# Patient Record
Sex: Male | Born: 1980 | Race: White | Hispanic: No
Health system: Southern US, Community
[De-identification: ages and names within clinical notes are randomized; demographics above are authoritative.]

---

## 2012-10-16 ENCOUNTER — Emergency Department (INDEPENDENT_AMBULATORY_CARE_PROVIDER_SITE_OTHER)
Admission: EM | Admit: 2012-10-16 | Discharge: 2012-10-16 | Disposition: A | Discharge status: Home / Self Care | Payer: Self-pay | Source: Home / Self Care

## 2012-10-16 ENCOUNTER — Encounter (HOSPITAL_COMMUNITY): Payer: Self-pay | Admitting: Emergency Medicine

## 2012-10-16 DIAGNOSIS — IMO0002 Reserved for concepts with insufficient information to code with codable children: Secondary | ICD-10-CM

## 2012-10-16 DIAGNOSIS — M5416 Radiculopathy, lumbar region: Secondary | ICD-10-CM

## 2012-10-16 MED ORDER — HYDROCODONE-ACETAMINOPHEN 5-325 MG PO TABS: 1.0000 | ORAL_TABLET | ORAL | Status: DC | PRN

## 2012-10-16 MED ORDER — METHYLPREDNISOLONE 4 MG PO KIT: PACK | ORAL | Status: DC

## 2012-10-16 NOTE — ED Provider Notes (Signed)
History     CSN: [id number]  Arrival date & time 10/16/12  1016   First MD Initiated Contact with Patient 10/16/12 1147      Chief Complaint  Patient presents with  . Sciatica    (Consider location/radiation/quality/duration/timing/severity/associated sxs/prior treatment) HPI Comments: 32 year old male presents with lumbar spine back pain. It developed approximately 2-3 days ago. Pain radiates into the anterolateral hips and down the thighs. He complains of distal thigh pain and numbness to the toes. Denies weakness. Pain is exacerbated by attempting to lie supine from sitting position and to rise back up. Is worse with bending, stooping and pulling. The pain is also increased by standing. He denies  known recent injury but states that several years ago he experienced an injury to his lumbar spine. 2008 he had an MRI and was told that he had what sounds like desiccated disc in L4 and 5. This pain syndrome occurs approximately once every 2-3 years.   History reviewed. No pertinent past medical history.  History reviewed. No pertinent past surgical history.  No family history on file.  History  Substance Use Topics  . Smoking status: Current Every Day Smoker -- 0.75 packs/day    Types: Cigarettes  . Smokeless tobacco: Not on file  . Alcohol Use: No      Review of Systems  Constitutional: Negative.   Respiratory: Negative.   Gastrointestinal: Negative.   Genitourinary: Negative.   Musculoskeletal: Positive for back pain.       As per HPI  Skin: Negative.   Neurological: Negative for dizziness, weakness, numbness and headaches.  Psychiatric/Behavioral: Negative.     Allergies  Review of patient's allergies indicates no known allergies.  Home Medications   Current Outpatient Rx  Name  Route  Sig  Dispense  Refill  . HYDROcodone-acetaminophen (NORCO/VICODIN) 5-325 MG per tablet   Oral   Take 1 tablet by mouth every 4 (four) hours as needed for pain.   15 tablet    0   . methylPREDNISolone (MEDROL DOSEPAK) 4 MG tablet      follow package directions   21 tablet   0     BP 109/68  Pulse 56  Temp(Src) 98.6 F (37 C) (Oral)  Resp 16  SpO2 100%  Physical Exam  Nursing note and vitals reviewed. Constitutional: He is oriented to person, place, and time. He appears well-developed and well-nourished.  HENT:  Head: Normocephalic and atraumatic.  Eyes: EOM are normal. Left eye exhibits no discharge.  Neck: Normal range of motion. Neck supple.  Cardiovascular: Normal rate and normal heart sounds.   Pulmonary/Chest: Effort normal and breath sounds normal. No respiratory distress.  Abdominal: Soft. He exhibits no distension. There is no tenderness.  Musculoskeletal:  Percussion tenderness to the mid lumbar spine and sacrum. No paralumbar muscular  tenderness. however, muscle pain is reproduced with movement is as described in history of present illness. No deformity, step off deformity of the spine. No tenderness in the hips or thighs. Negative straight leg raises bilaterally distal neurovascular and motor sensory is intact.  Neurological: He is alert and oriented to person, place, and time. No cranial nerve deficit.  Skin: Skin is warm and dry.  Psychiatric: He has a normal mood and affect.    ED Course  Procedures (including critical care time)  Labs Reviewed - No data to display No results found.   1. Lumbar radicular pain       MDM  Limit activities that exacerbate the pain.  Apply ice to the back. Medrol Dosepak as directed Norco for pain every 4 hours when necessary, #15 Call the orthopedist above for further evaluation.  Declined to have an injection such as Toradol.        Hayden Rasmussen, NP 10/16/12 [phone]

## 2012-10-16 NOTE — ED Notes (Signed)
Pt c/o possible sciatica. This is a recurrent problem. Feels pain from hips that radiates down to both legs. Numbness in toes. Has been rotating between heat and ice with mild relief. Took 1/2 Hydrocodone this morning. Patient is alert and oriented.

## 2012-10-16 NOTE — ED Provider Notes (Signed)
Medical screening examination/treatment/procedure(s) were performed by non-physician practitioner and as supervising physician I was immediately available for consultation/collaboration.  Jihan Mellette   Tzirel Leonor, MD 10/16/12 1410 

## 2015-05-28 ENCOUNTER — Encounter (HOSPITAL_COMMUNITY): Payer: Self-pay | Admitting: Emergency Medicine

## 2015-05-28 ENCOUNTER — Emergency Department (HOSPITAL_COMMUNITY)
Admission: EM | Admit: 2015-05-28 | Discharge: 2015-05-28 | Disposition: A | Discharge status: Home / Self Care | Payer: Self-pay | Attending: Emergency Medicine | Admitting: Emergency Medicine

## 2015-05-28 DIAGNOSIS — F17210 Nicotine dependence, cigarettes, uncomplicated: Secondary | ICD-10-CM | POA: Insufficient documentation

## 2015-05-28 DIAGNOSIS — S50812A Abrasion of left forearm, initial encounter: Secondary | ICD-10-CM | POA: Insufficient documentation

## 2015-05-28 DIAGNOSIS — Y998 Other external cause status: Secondary | ICD-10-CM | POA: Insufficient documentation

## 2015-05-28 DIAGNOSIS — Y9389 Activity, other specified: Secondary | ICD-10-CM | POA: Insufficient documentation

## 2015-05-28 DIAGNOSIS — S1081XA Abrasion of other specified part of neck, initial encounter: Secondary | ICD-10-CM | POA: Insufficient documentation

## 2015-05-28 DIAGNOSIS — Y9289 Other specified places as the place of occurrence of the external cause: Secondary | ICD-10-CM | POA: Insufficient documentation

## 2015-05-28 DIAGNOSIS — T07XXXA Unspecified multiple injuries, initial encounter: Secondary | ICD-10-CM
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
## 2015-05-28 NOTE — Discharge Instructions (Signed)
Return here as needed.  Keep the areas clean and dry. °

## 2015-05-28 NOTE — ED Notes (Signed)
PA at bedside.

## 2016-01-18 ENCOUNTER — Inpatient Hospital Stay (HOSPITAL_COMMUNITY)
Admission: EM | Admit: 2016-01-18 | Discharge: 2016-01-24 | DRG: 581 | Disposition: A | Discharge status: Home / Self Care | Payer: Self-pay | Attending: General Surgery | Admitting: General Surgery

## 2016-01-18 ENCOUNTER — Encounter (HOSPITAL_COMMUNITY): Payer: Self-pay | Admitting: Emergency Medicine

## 2016-01-18 DIAGNOSIS — R2242 Localized swelling, mass and lump, left lower limb: Secondary | ICD-10-CM

## 2016-01-18 DIAGNOSIS — L0291 Cutaneous abscess, unspecified: Secondary | ICD-10-CM

## 2016-01-18 DIAGNOSIS — F17210 Nicotine dependence, cigarettes, uncomplicated: Secondary | ICD-10-CM | POA: Diagnosis present

## 2016-01-18 DIAGNOSIS — L02416 Cutaneous abscess of left lower limb: Principal | ICD-10-CM | POA: Diagnosis present

## 2016-01-18 NOTE — ED Triage Notes (Signed)
P presents to ED for assessment of a large "tumor-like" thing that has "been there for years", grew and started bleeding" today.  Pt sts this area has doubled in size x 2 months.  Pt sts painful, especially with palpation

## 2016-01-19 ENCOUNTER — Encounter (HOSPITAL_COMMUNITY): Admission: EM | Disposition: A | Discharge status: Home / Self Care | Payer: Self-pay | Source: Home / Self Care

## 2016-01-19 ENCOUNTER — Inpatient Hospital Stay (HOSPITAL_COMMUNITY): Payer: Self-pay | Admitting: Anesthesiology

## 2016-01-19 ENCOUNTER — Encounter (HOSPITAL_COMMUNITY): Payer: Self-pay | Admitting: Anesthesiology

## 2016-01-19 DIAGNOSIS — L02416 Cutaneous abscess of left lower limb: Secondary | ICD-10-CM | POA: Diagnosis present

## 2016-01-19 HISTORY — PX: INCISION AND DRAINAGE ABSCESS: SHX5864

## 2016-01-19 LAB — CBC WITH DIFFERENTIAL/PLATELET
BASOS ABS: 0 10*3/uL (ref 0.0–0.1)
Basophils Relative: 0 %
Eosinophils Absolute: 0.1 10*3/uL (ref 0.0–0.7)
Eosinophils Relative: 1 %
HEMATOCRIT: 41.3 % (ref 39.0–52.0)
Hemoglobin: 13.8 g/dL (ref 13.0–17.0)
Lymphocytes Relative: 27 %
Lymphs Abs: 2.7 10*3/uL (ref 0.7–4.0)
MCH: 29.7 pg (ref 26.0–34.0)
MCHC: 33.4 g/dL (ref 30.0–36.0)
MCV: 89 fL (ref 78.0–100.0)
MONO ABS: 0.7 10*3/uL (ref 0.1–1.0)
MONOS PCT: 7 %
Neutro Abs: 6.6 10*3/uL (ref 1.7–7.7)
Neutrophils Relative %: 66 %
Platelets: 299 10*3/uL (ref 150–400)
RBC: 4.64 MIL/uL (ref 4.22–5.81)
RDW: 12.9 % (ref 11.5–15.5)
WBC: 10 10*3/uL (ref 4.0–10.5)

## 2016-01-19 LAB — BASIC METABOLIC PANEL
ANION GAP: 10 (ref 5–15)
Anion gap: 10 (ref 5–15)
BUN: 8 mg/dL (ref 6–20)
BUN: 8 mg/dL (ref 6–20)
CALCIUM: 9.5 mg/dL (ref 8.9–10.3)
CHLORIDE: 99 mmol/L — AB (ref 101–111)
CO2: 24 mmol/L (ref 22–32)
CO2: 28 mmol/L (ref 22–32)
CREATININE: 0.88 mg/dL (ref 0.61–1.24)
CREATININE: 0.88 mg/dL (ref 0.61–1.24)
Calcium: 9.2 mg/dL (ref 8.9–10.3)
Chloride: 100 mmol/L — ABNORMAL LOW (ref 101–111)
GFR calc Af Amer: >60 mL/min (ref >60)
GFR calc non Af Amer: >60 mL/min (ref >60)
GLUCOSE: 103 mg/dL — AB (ref 65–99)
GLUCOSE: 103 mg/dL — AB (ref 65–99)
Potassium: 3.7 mmol/L (ref 3.5–5.1)
Potassium: 3.5 mmol/L (ref 3.5–5.1)
SODIUM: 134 mmol/L — AB (ref 135–145)
Sodium: 137 mmol/L (ref 135–145)

## 2016-01-19 LAB — CBC
HEMATOCRIT: 39.5 % (ref 39.0–52.0)
HEMOGLOBIN: 13.1 g/dL (ref 13.0–17.0)
MCH: 30 pg (ref 26.0–34.0)
MCHC: 33.2 g/dL (ref 30.0–36.0)
MCV: 90.4 fL (ref 78.0–100.0)
Platelets: 300 10*3/uL (ref 150–400)
RBC: 4.37 MIL/uL (ref 4.22–5.81)
RDW: 13 % (ref 11.5–15.5)
WBC: 8.2 10*3/uL (ref 4.0–10.5)

## 2016-01-19 LAB — SURGICAL PCR SCREEN
MRSA, PCR: NEGATIVE
STAPHYLOCOCCUS AUREUS: NEGATIVE

## 2016-01-19 LAB — I-STAT TROPONIN, ED: Troponin i, poc: 0 ng/mL (ref 0.00–0.08)

## 2016-01-19 LAB — I-STAT CG4 LACTIC ACID, ED: LACTIC ACID, VENOUS: 1.63 mmol/L (ref 0.5–1.9)

## 2016-01-19 SURGERY — INCISION AND DRAINAGE, ABSCESS
Anesthesia: General | Site: Thigh | Laterality: Left

## 2016-01-19 MED ORDER — FENTANYL CITRATE (PF) 100 MCG/2ML IJ SOLN
25.0000 ug | INTRAMUSCULAR | Status: DC | PRN
  Administered 2016-01-19: 50 ug via INTRAVENOUS

## 2016-01-19 MED ORDER — BUPIVACAINE-EPINEPHRINE (PF) 0.25% -1:200000 IJ SOLN
INTRAMUSCULAR | Status: AC
  Filled 2016-01-19: qty 30

## 2016-01-19 MED ORDER — ONDANSETRON HCL 4 MG/2ML IJ SOLN
INTRAMUSCULAR | Status: DC | PRN
  Administered 2016-01-19: 4 mg via INTRAVENOUS

## 2016-01-19 MED ORDER — DIPHENHYDRAMINE HCL 50 MG/ML IJ SOLN: 25.0000 mg | Freq: Four times a day (QID) | INTRAMUSCULAR | Status: DC | PRN

## 2016-01-19 MED ORDER — PROPOFOL 10 MG/ML IV BOLUS
INTRAVENOUS | Status: DC | PRN
  Administered 2016-01-19: 200 mg via INTRAVENOUS

## 2016-01-19 MED ORDER — 0.9 % SODIUM CHLORIDE (POUR BTL) OPTIME
TOPICAL | Status: DC | PRN
  Administered 2016-01-19: 1000 mL

## 2016-01-19 MED ORDER — BUPIVACAINE-EPINEPHRINE 0.25% -1:200000 IJ SOLN
INTRAMUSCULAR | Status: DC | PRN
  Administered 2016-01-19: 10 mL

## 2016-01-19 MED ORDER — POTASSIUM CHLORIDE IN NACL 20-0.9 MEQ/L-% IV SOLN
INTRAVENOUS | Status: DC
  Administered 2016-01-19: 08:00:00 via INTRAVENOUS
  Administered 2016-01-19: 125 mL via INTRAVENOUS
  Administered 2016-01-19: 22:00:00 via INTRAVENOUS
  Administered 2016-01-20: 1000 mL via INTRAVENOUS
  Administered 2016-01-21 – 2016-01-23 (×4): via INTRAVENOUS
  Filled 2016-01-19 (×9): qty 1000

## 2016-01-19 MED ORDER — OXYCODONE HCL 5 MG PO TABS: 5.0000 mg | ORAL_TABLET | Freq: Once | ORAL | Status: DC | PRN

## 2016-01-19 MED ORDER — CEFAZOLIN IN D5W 1 GM/50ML IV SOLN
1.0000 g | Freq: Once | INTRAVENOUS | Status: AC
Start: 2016-01-19 — End: 2016-01-19
  Administered 2016-01-19: 1 g via INTRAVENOUS
  Filled 2016-01-19: qty 50

## 2016-01-19 MED ORDER — CHLORHEXIDINE GLUCONATE CLOTH 2 % EX PADS
6.0000 | MEDICATED_PAD | Freq: Once | CUTANEOUS | Status: AC
  Administered 2016-01-19: 6 via TOPICAL

## 2016-01-19 MED ORDER — FENTANYL CITRATE (PF) 100 MCG/2ML IJ SOLN
INTRAMUSCULAR | Status: DC | PRN
  Administered 2016-01-19 (×4): 25 ug via INTRAVENOUS

## 2016-01-19 MED ORDER — HYDROMORPHONE HCL 1 MG/ML IJ SOLN
1.0000 mg | INTRAMUSCULAR | Status: DC | PRN
  Administered 2016-01-19 – 2016-01-24 (×19): 1 mg via INTRAVENOUS
  Filled 2016-01-19 (×20): qty 1

## 2016-01-19 MED ORDER — PROPOFOL 10 MG/ML IV BOLUS
INTRAVENOUS | Status: AC
  Filled 2016-01-19: qty 20

## 2016-01-19 MED ORDER — PROMETHAZINE HCL 25 MG/ML IJ SOLN: 6.2500 mg | INTRAMUSCULAR | Status: DC | PRN

## 2016-01-19 MED ORDER — CHLORHEXIDINE GLUCONATE CLOTH 2 % EX PADS: 6.0000 | MEDICATED_PAD | Freq: Once | CUTANEOUS | Status: DC

## 2016-01-19 MED ORDER — FENTANYL CITRATE (PF) 100 MCG/2ML IJ SOLN
INTRAMUSCULAR | Status: AC
  Filled 2016-01-19: qty 2

## 2016-01-19 MED ORDER — OXYCODONE HCL 5 MG/5ML PO SOLN: 5.0000 mg | Freq: Once | ORAL | Status: DC | PRN

## 2016-01-19 MED ORDER — ENOXAPARIN SODIUM 40 MG/0.4ML ~~LOC~~ SOLN
40.0000 mg | SUBCUTANEOUS | Status: DC
  Filled 2016-01-19 (×3): qty 0.4

## 2016-01-19 MED ORDER — OXYCODONE-ACETAMINOPHEN 5-325 MG PO TABS
1.0000 | ORAL_TABLET | ORAL | Status: DC | PRN
  Administered 2016-01-20 – 2016-01-23 (×2): 1 via ORAL
  Administered 2016-01-23 – 2016-01-24 (×3): 2 via ORAL
  Filled 2016-01-19: qty 1
  Filled 2016-01-19 (×2): qty 2
  Filled 2016-01-19: qty 1
  Filled 2016-01-19: qty 2

## 2016-01-19 MED ORDER — MIDAZOLAM HCL 2 MG/2ML IJ SOLN
INTRAMUSCULAR | Status: AC
  Filled 2016-01-19: qty 2

## 2016-01-19 MED ORDER — ONDANSETRON 4 MG PO TBDP: 4.0000 mg | ORAL_TABLET | Freq: Four times a day (QID) | ORAL | Status: DC | PRN

## 2016-01-19 MED ORDER — LACTATED RINGERS IV SOLN
INTRAVENOUS | Status: DC | PRN
  Administered 2016-01-19 (×2): via INTRAVENOUS

## 2016-01-19 MED ORDER — DIPHENHYDRAMINE HCL 25 MG PO CAPS
25.0000 mg | ORAL_CAPSULE | Freq: Four times a day (QID) | ORAL | Status: DC | PRN
  Administered 2016-01-19 – 2016-01-24 (×5): 25 mg via ORAL
  Filled 2016-01-19 (×5): qty 1

## 2016-01-19 MED ORDER — ONDANSETRON HCL 4 MG/2ML IJ SOLN: 4.0000 mg | Freq: Four times a day (QID) | INTRAMUSCULAR | Status: DC | PRN

## 2016-01-19 MED ORDER — MIDAZOLAM HCL 2 MG/2ML IJ SOLN
INTRAMUSCULAR | Status: DC | PRN
  Administered 2016-01-19: 2 mg via INTRAVENOUS

## 2016-01-19 MED ORDER — ONDANSETRON HCL 4 MG/2ML IJ SOLN
INTRAMUSCULAR | Status: AC
  Filled 2016-01-19: qty 2

## 2016-01-19 MED ORDER — PIPERACILLIN-TAZOBACTAM 3.375 G IVPB
3.3750 g | Freq: Three times a day (TID) | INTRAVENOUS | Status: DC
  Administered 2016-01-19 – 2016-01-23 (×13): 3.375 g via INTRAVENOUS
  Filled 2016-01-19 (×15): qty 50

## 2016-01-19 MED ORDER — LIDOCAINE HCL (CARDIAC) 20 MG/ML IV SOLN
INTRAVENOUS | Status: DC | PRN
  Administered 2016-01-19: 80 mg via INTRATRACHEAL

## 2016-01-19 MED ORDER — DEXAMETHASONE SODIUM PHOSPHATE 10 MG/ML IJ SOLN
INTRAMUSCULAR | Status: DC | PRN
  Administered 2016-01-19: 10 mg via INTRAVENOUS

## 2016-01-19 SURGICAL SUPPLY — 39 items
BANDAGE ACE 6X5 VEL STRL LF (GAUZE/BANDAGES/DRESSINGS) ×2 IMPLANT
BNDG GAUZE ELAST 4 BULKY (GAUZE/BANDAGES/DRESSINGS) ×2 IMPLANT
CANISTER SUCTION 2500CC (MISCELLANEOUS) ×2 IMPLANT
CLEANER TIP ELECTROSURG 2X2 (MISCELLANEOUS) ×2 IMPLANT
CONT SPEC 4OZ CLIKSEAL STRL BL (MISCELLANEOUS) ×4 IMPLANT
COVER SURGICAL LIGHT HANDLE (MISCELLANEOUS) ×2 IMPLANT
DRAPE IMP U-DRAPE 54X76 (DRAPES) ×2 IMPLANT
DRAPE LAPAROSCOPIC ABDOMINAL (DRAPES) ×2 IMPLANT
DRAPE ORTHO SPLIT 77X108 STRL (DRAPES) ×2
DRAPE SURG ORHT 6 SPLT 77X108 (DRAPES) ×2 IMPLANT
DRAPE UTILITY XL STRL (DRAPES) ×2 IMPLANT
DRSG PAD ABDOMINAL 8X10 ST (GAUZE/BANDAGES/DRESSINGS) ×2 IMPLANT
ELECT CAUTERY BLADE 6.4 (BLADE) ×2 IMPLANT
ELECT REM PT RETURN 9FT ADLT (ELECTROSURGICAL) ×2
ELECTRODE REM PT RTRN 9FT ADLT (ELECTROSURGICAL) ×1 IMPLANT
GAUZE SPONGE 4X4 12PLY STRL (GAUZE/BANDAGES/DRESSINGS) IMPLANT
GLOVE BIO SURGEON STRL SZ8 (GLOVE) ×2 IMPLANT
GLOVE BIOGEL PI IND STRL 8 (GLOVE) ×1 IMPLANT
GLOVE BIOGEL PI IND STRL 8.5 (GLOVE) ×1 IMPLANT
GLOVE BIOGEL PI INDICATOR 8 (GLOVE) ×1
GLOVE BIOGEL PI INDICATOR 8.5 (GLOVE) ×1
GLOVE SURG SS PI 8.0 STRL IVOR (GLOVE) ×2 IMPLANT
GOWN STRL REUS W/ TWL XL LVL3 (GOWN DISPOSABLE) ×2 IMPLANT
GOWN STRL REUS W/TWL XL LVL3 (GOWN DISPOSABLE) ×2
KIT BASIN OR (CUSTOM PROCEDURE TRAY) ×2 IMPLANT
KIT ROOM TURNOVER OR (KITS) ×2 IMPLANT
NEEDLE HYPO 25GX1X1/2 BEV (NEEDLE) ×2 IMPLANT
NS IRRIG 1000ML POUR BTL (IV SOLUTION) ×2 IMPLANT
PACK GENERAL/GYN (CUSTOM PROCEDURE TRAY) ×2 IMPLANT
PAD ARMBOARD 7.5X6 YLW CONV (MISCELLANEOUS) ×4 IMPLANT
SPECIMEN JAR SMALL (MISCELLANEOUS) IMPLANT
SUT SILK 2 0 (SUTURE) ×1
SUT SILK 2-0 18XBRD TIE 12 (SUTURE) ×1 IMPLANT
SUT VICRYL AB 2 0 TIES (SUTURE) ×2 IMPLANT
SWAB COLLECTION DEVICE MRSA (MISCELLANEOUS) ×2 IMPLANT
SYR CONTROL 10ML LL (SYRINGE) ×2 IMPLANT
TOWEL OR 17X24 6PK STRL BLUE (TOWEL DISPOSABLE) ×4 IMPLANT
TOWEL OR 17X26 10 PK STRL BLUE (TOWEL DISPOSABLE) ×2 IMPLANT
TUBE ANAEROBIC SPECIMEN COL (MISCELLANEOUS) ×2 IMPLANT

## 2016-01-19 NOTE — Anesthesia Preprocedure Evaluation (Addendum)
Anesthesia Evaluation  Patient identified by MRN, date of birth, ID band Patient awake    Reviewed: Allergy & Precautions, NPO status , Patient's Chart, lab work & pertinent test results  Airway Mallampati: II  TM Distance: >3 FB Neck ROM: Full    Dental no notable dental hx.    Pulmonary neg pulmonary ROS, Current Smoker,    Pulmonary exam normal        Cardiovascular negative cardio ROS Normal cardiovascular exam     Neuro/Psych negative neurological ROS     GI/Hepatic negative GI ROS, (+)     substance abuse  alcohol use and marijuana use,   Endo/Other  negative endocrine ROS  Renal/GU negative Renal ROS     Musculoskeletal negative musculoskeletal ROS (+)   Abdominal   Peds  Hematology negative hematology ROS (+)   Anesthesia Other Findings   Reproductive/Obstetrics                            Anesthesia Physical Anesthesia Plan  ASA: I  Anesthesia Plan: General   Post-op Pain Management:    Induction: Intravenous  Airway Management Planned: LMA  Additional Equipment: None  Intra-op Plan:   Post-operative Plan: Extubation in OR  Informed Consent: I have reviewed the patients History and Physical, chart, labs and discussed the procedure including the risks, benefits and alternatives for the proposed anesthesia with the patient or authorized representative who has indicated his/her understanding and acceptance.   Dental advisory given  Plan Discussed with:   Anesthesia Plan Comments:         Anesthesia Quick Evaluation

## 2016-01-19 NOTE — Interval H&P Note (Signed)
History and Physical Interval Note:  01/19/2016 9:00 AM  Cephus RicherJonathan Schwalm  has presented today for surgery, with the diagnosis of Left Thigh Abscess  The various methods of treatment have been discussed with the patient and family. After consideration of risks, benefits and other options for treatment, the patient has consented to  Procedure(s): INCISION AND DRAINAGE ABSCESS LEFT THIGH (Left) as a surgical intervention .  The patient's history has been reviewed, patient examined, no change in status, stable for surgery.  I have reviewed the patient's chart and labs.  Pt seen and examined.   Case discussed with the patient  Potential complications and wound care discussed and prolonged healing taking up to 3 - 6 months or longer.  He has ignored this for over a year.  May need wound vac as well. Potential complications discussed.  The procedure has been discussed with the patient.  Alternative therapies have been discussed with the patient.  Operative risks include bleeding,  Infection,  Organ injury,  Nerve injury,  Blood vessel injury,  DVT,  Pulmonary embolism,  Death,  And possible reoperation.  Medical management risks include worsening of present situation.  The success of the procedure is 50 -90 % at treating patients symptoms.  The patient understands and agrees to proceed.   Questions were answered to the patient's satisfaction.     Idamae Coccia A.

## 2016-01-19 NOTE — Progress Notes (Signed)
Pt arrived to 5 west at 0645. Fully alert and oriented on admission, VS stable, no signs of distress. Pt oriented to unit, call light with in reach. Report given to day shift nurse.

## 2016-01-19 NOTE — Op Note (Signed)
Preoperative diagnosis: Left thigh abscess measuring 10 x 15 cm  Postoperative diagnosis: Infected left thigh mass with abscess involving posterior compartment of left upper thigh  Procedure: Incision and drainage of left upper thigh  mass with biopsy of large necrotic left upper posterior medial thigh mass   10 x 15 cm   Surgeon: Erroll Luna M.D.  Anesthesia: Gen. with 0.25% Sensorcaine local  EBL: 100 mL  Specimens: Multiple biopsies of skin and subcutaneous mass to pathology with wound cultures taken  Drains: None  Indications for procedure: The patient presents for drainage of a large left posterior thigh abscess.  He states this area has been present for well over one year. It has gotten larger over the last 4-6 months. He was seen this morning by Dr.Tsuei in the emergency room and felt to have a large left thigh abscess that has been neglected. Patient is a poor historian. He was seen in the holding area and his left eye was examined. His left lower extremity was neurovascularly intact. His motor and sensory function were intact. Examination of his left upper posterior medial thigh revealed a softball size mass with a central area of necrosis and dead  tissue. This was red  and tender.  I discussed the procedure great length with him. Recommended incision and drainage of this infectious process. There are be a large open wound since this area would potentially have to be extensively debrided. Discussed risk of the procedure with him preoperatively to include but not exclusive of bleeding, infection, injury to blood vessel, injury to nerve leading to numbness and or loss of function of extremity, death, DVT, progression of underlying process, potential limb loss, and the need for other treatments and or surgery. He voiced his understanding and agreed to proceed.  Description of procedure: The patient was met in the holding area. Left leg was marked as the correct side at the operative site.  He was taken back to the operating room after answering all his questions. He was placed supine on the OR table and his left leg was frog leg position. General anesthesia was initiated. Timeout was done to verify proper patient and procedure. He was already placed on antibiotics in the emergency room and these were continued. After sterile prep and drape the ulcerative process in the left posterior thigh was opened with cautery. This was about 10 cm in maximal diameter and extended into the posterior compartment of his left upper thigh involving the musculature. Upon unroofing the abscess, there is a large necrotic mass. Multiple biopsies of this mass were taken and sent to pathology. The mass extended medially to include the saphenous vein superficially. This was thrombosed upon examination and ligated. The process had the appearance of a neoplastic process that is now infected and neglected. The wound was copiously irrigated with saline. Hemostasis was achieved with cautery. The opening to the wound measured 5 cm x 7 cm and was about 2 cm deep. All pathology specimen and culture specimens taken were sent to microbiology and pathology. Sterile dressing of Kerlix was wrapped after packing the wound with saline soaked Kerlix. All large Ace wrap was placed. All final counts were found to be correct. The patient was awoke taken recovery in satisfactory condition.

## 2016-01-19 NOTE — H&P (Signed)
Tom Le is an 35 y.o. male.   Chief Complaint: Left thigh abscess HPI: This is a 35 yo male in otherwise good health who presents with a large area of swelling, inflammation, and drainage from his proximal medial left thigh.  He states that he has had a small mass there for over a year, but over the last several weeks it has rapidly enlarged and become more tender.  Over the last few days, it has begun draining.  He finally came to the ED for evaluation.  No imaging performed.  History reviewed. No pertinent past medical history.  History reviewed. No pertinent surgical history.  History reviewed. No pertinent family history. Social History:  reports that he has been smoking Cigarettes.  He has been smoking about 1.00 pack per day. He has never used smokeless tobacco. He reports that he drinks alcohol. He reports that he uses drugs, including Marijuana, about 2 times per week.  Allergies: No Known Allergies  Prior to Admission medications   Not on File     Results for orders placed or performed during the hospital encounter of 01/18/16 (from the past 48 hour(s))  Basic metabolic panel     Status: Abnormal   Collection Time: 01/19/16  2:25 AM  Result Value Ref Range   Sodium 134 (L) 135 - 145 mmol/L   Potassium 3.7 3.5 - 5.1 mmol/L   Chloride 100 (L) 101 - 111 mmol/L   CO2 24 22 - 32 mmol/L   Glucose, Bld 103 (H) 65 - 99 mg/dL   BUN 8 6 - 20 mg/dL   Creatinine, Ser 0.88 0.61 - 1.24 mg/dL   Calcium 9.5 8.9 - 10.3 mg/dL   GFR calc non Af Amer >60 >60 mL/min   GFR calc Af Amer >60 >60 mL/min    Comment: (NOTE) The eGFR has been calculated using the CKD EPI equation. This calculation has not been validated in all clinical situations. eGFR's persistently <60 mL/min signify possible Chronic Kidney Disease.    Anion gap 10 5 - 15  CBC with Differential     Status: None   Collection Time: 01/19/16  2:25 AM  Result Value Ref Range   WBC 10.0 4.0 - 10.5 K/uL   RBC 4.64 4.22  - 5.81 MIL/uL   Hemoglobin 13.8 13.0 - 17.0 g/dL   HCT 41.3 39.0 - 52.0 %   MCV 89.0 78.0 - 100.0 fL   MCH 29.7 26.0 - 34.0 pg   MCHC 33.4 30.0 - 36.0 g/dL   RDW 12.9 11.5 - 15.5 %   Platelets 299 150 - 400 K/uL   Neutrophils Relative % 66 %   Neutro Abs 6.6 1.7 - 7.7 K/uL   Lymphocytes Relative 27 %   Lymphs Abs 2.7 0.7 - 4.0 K/uL   Monocytes Relative 7 %   Monocytes Absolute 0.7 0.1 - 1.0 K/uL   Eosinophils Relative 1 %   Eosinophils Absolute 0.1 0.0 - 0.7 K/uL   Basophils Relative 0 %   Basophils Absolute 0.0 0.0 - 0.1 K/uL  I-stat troponin, ED     Status: None   Collection Time: 01/19/16  2:41 AM  Result Value Ref Range   Troponin i, poc 0.00 0.00 - 0.08 ng/mL   Comment 3            Comment: Due to the release kinetics of cTnI, a negative result within the first hours of the onset of symptoms does not rule out myocardial infarction with certainty. If  myocardial infarction is still suspected, repeat the test at appropriate intervals.   I-Stat CG4 Lactic Acid, ED     Status: None   Collection Time: 01/19/16  3:13 AM  Result Value Ref Range   Lactic Acid, Venous 1.63 0.5 - 1.9 mmol/L   No results found.  Review of Systems  Constitutional: Negative for weight loss.  HENT: Negative for ear discharge, ear pain, hearing loss and tinnitus.   Eyes: Negative for blurred vision, double vision, photophobia and pain.  Respiratory: Negative for cough, sputum production and shortness of breath.   Cardiovascular: Positive for leg swelling (Proximal medial left thigh). Negative for chest pain.  Gastrointestinal: Negative for abdominal pain, nausea and vomiting.  Genitourinary: Negative for dysuria, flank pain, frequency and urgency.  Musculoskeletal: Negative for back pain, falls, joint pain, myalgias and neck pain.  Neurological: Negative for dizziness, tingling, sensory change, focal weakness, loss of consciousness and headaches.  Endo/Heme/Allergies: Does not bruise/bleed easily.   Psychiatric/Behavioral: Negative for depression, memory loss and substance abuse. The patient is not nervous/anxious.     Blood pressure 96/67, pulse 81, temperature 98.5 F (36.9 C), temperature source Oral, resp. rate 18, height 5' 6"  (1.676 m), weight 92.9 kg (204 lb 14.4 oz), SpO2 98 %. Physical Exam  WDWN in NAD HEENT:  EOMI, sclera anicteric Neck:  No masses, no thyromegaly Lungs:  CTA bilaterally; normal respiratory effort CV:  Regular rate and rhythm; no murmurs Abd:  +bowel sounds, soft, non-tender, no masses Ext:  Well-perfused; no edema Skin:  Warm, dry; proximal medial left thigh - 10 cm area of swelling and firm induration with some central drainage; about 20 cm of surrounding cellulitis.  The entire area of firmness does not seem to be fixed to underlying muscle.  Assessment/Plan Large left thigh abscess   Admit to hospital To OR later today for incision/ drainage/ debridement of large left thigh abscess.  Patient will likely have a large open wound for some time.  IV abx NPO    Tory Mckissack K., MD 01/19/2016, 4:29 AM

## 2016-01-19 NOTE — Transfer of Care (Signed)
Immediate Anesthesia Transfer of Care Note  Patient: Tom RicherJonathan Wholey  Procedure(s) Performed: Procedure(s): INCISION AND DRAINAGE ABSCESS LEFT THIGH (Left)  Patient Location: PACU  Anesthesia Type:General  Level of Consciousness: awake, alert , oriented and patient cooperative  Airway & Oxygen Therapy: Patient Spontanous Breathing  Post-op Assessment: Report given to RN and Post -op Vital signs reviewed and stable  Post vital signs: Reviewed and stable  Last Vitals:  Vitals:   01/19/16 0652 01/19/16 1030  BP: 124/75 128/84  Pulse: 80 81  Resp: 18 15  Temp: 37.1 C 36.8 C    Last Pain:  Vitals:   01/19/16 0755  TempSrc:   PainSc: 0-No pain         Complications: No apparent anesthesia complications

## 2016-01-19 NOTE — ED Provider Notes (Signed)
[facility]-EMERGENCY DEPT Provider Note   CSN: [id number] Arrival date & time: 01/18/16  2239     History   Chief Complaint Chief Complaint  Patient presents with  . Abscess    HPI Tom Le is a 35 y.o. male.  Tom Le is a 35 y.o. Male with no pertinent medical history presents to ED with complaint of abscess on his left upper thigh. Patient states abscess has been present for awhile; however, over the last three weeks it has tripled in. Approximately 24 hours ago, patient reports abscess started draining blood. Endorses associated surround erythema, warmth, and pain. No fever, chills, night sweats, abdominal pain, dysuria, hematuria, chest pain, SOB, arthralgias, or myalgias.        History reviewed. No pertinent past medical history.  There are no active problems to display for this patient.   History reviewed. No pertinent surgical history.     Home Medications    Prior to Admission medications   Not on File    Family History History reviewed. No pertinent family history.  Social History Social History  Substance Use Topics  . Smoking status: Current Every Day Smoker    Packs/day: 1.00    Types: Cigarettes  . Smokeless tobacco: Never Used  . Alcohol use Yes     Comment: beer and liquor almost every day     Allergies   Review of patient's allergies indicates no known allergies.   Review of Systems Review of Systems  Skin: Positive for wound.  All other systems reviewed and are negative.    Physical Exam Updated Vital Signs BP 131/97 (BP Location: Left Arm)   Pulse 106   Temp 98.5 F (36.9 C) (Oral)   Resp 18   Ht 5\' 6"  (1.676 m)   Wt 92.9 kg   SpO2 99%   BMI 33.07 kg/m   Physical Exam  Constitutional: He appears well-developed and well-nourished. No distress.  HENT:  Head: Normocephalic and atraumatic.  Mouth/Throat: Oropharynx is clear and moist. No oropharyngeal exudate.  Eyes: Conjunctivae and EOM are normal.  Pupils are equal, round, and reactive to light. Right eye exhibits no discharge. Left eye exhibits no discharge. No scleral icterus.  Neck: Normal range of motion. Neck supple.  Cardiovascular: Normal rate, regular rhythm, normal heart sounds and intact distal pulses.   No murmur heard. Pulmonary/Chest: Effort normal and breath sounds normal. No respiratory distress.  Abdominal: Soft. Bowel sounds are normal. There is no tenderness. There is no rebound and no guarding.  Musculoskeletal: Normal range of motion.  Lymphadenopathy:    He has no cervical adenopathy.  Neurological: He is alert. Coordination normal.  Skin: Skin is warm and dry. He is not diaphoretic.  Large abscess approximate size of small canteloupe with surrounding cellulitis noted to left medial thigh. Area of fluctuance noted around 3cm ulceration. Pictures present in chart.   Psychiatric: He has a normal mood and affect. His behavior is normal.     ED Treatments / Results  Labs (all labs ordered are listed, but only abnormal results are displayed) Labs Reviewed  BASIC METABOLIC PANEL - Abnormal; Notable for the following:       Result Value   Sodium 134 (*)    Chloride 100 (*)    Glucose, Bld 103 (*)    All other components within normal limits  CULTURE, BLOOD (ROUTINE X 2)  CULTURE, BLOOD (ROUTINE X 2)  CBC WITH DIFFERENTIAL/PLATELET  I-STAT TROPOININ, ED  I-STAT CG4 LACTIC ACID, ED  EKG  EKG Interpretation None       Radiology No results found.      Procedures Procedures (including critical care time)  Medications Ordered in ED Medications  ceFAZolin (ANCEF) IVPB 1 g/50 mL premix (1 g Intravenous New Bag/Given 01/19/16 0343)     Initial Impression / Assessment and Plan / ED Course  I have reviewed the triage vital signs and the nursing notes.  Pertinent labs & imaging results that were available during my care of the patient were reviewed by me and considered in my medical decision making  (see chart for details).  Clinical Course    Patient is afebrile and non-toxic appearing in NAD. Vital signs remarkable for tachycardia, otherwise stable. Physical exam remarkable for large abscess to left medial thigh with surround cellulitis. Given extensiveness of abscess will check labs and consult surgery. Patient discussed and also seen by Dr. Wilkie AyeHorton, agrees with plan. Labs are re-assuring. Consult to General Surgery placed.   3:30 AM: Spoke with Dr. Corliss Skainssuei of General Surgery, greatly appreciated his time and input. Agrees to see patient. Patient NPO.  4:33 AM: patient to go to OR.    Final Clinical Impressions(s) / ED Diagnoses   Final diagnoses:  None    New Prescriptions New Prescriptions   No medications on file     Lona Kettleshley Laurel Meyer, PA-C 01/19/16 [id number]    Shon Batonourtney F Horton, MD 01/19/16 [phone]

## 2016-01-20 ENCOUNTER — Encounter (HOSPITAL_COMMUNITY): Payer: Self-pay | Admitting: Surgery

## 2016-01-20 NOTE — Anesthesia Postprocedure Evaluation (Signed)
Anesthesia Post Note  Patient: Christiane HaJonathan Tool  Procedure(s) Performed: Procedure(s) (LRB): INCISION AND DRAINAGE ABSCESS LEFT THIGH (Left)  Patient location during evaluation: PACU Anesthesia Type: General Level of consciousness: awake and alert Pain management: pain level controlled Vital Signs Assessment: post-procedure vital signs reviewed and stable Respiratory status: spontaneous breathing, nonlabored ventilation, respiratory function stable and patient connected to nasal cannula oxygen Cardiovascular status: blood pressure returned to baseline and stable Postop Assessment: no signs of nausea or vomiting Anesthetic complications: no    Last Vitals:  Vitals:   01/19/16 2213 01/20/16 0530  BP: 117/62 108/63  Pulse: (!) 57 (!) 55  Resp: 18 18  Temp: 36.8 C 36.3 C    Last Pain:  Vitals:   01/20/16 0800  TempSrc:   PainSc: 2                  Bonita Quinichard S Eusevio Schriver

## 2016-01-20 NOTE — Progress Notes (Signed)
Patient ID: Cephus RicherJonathan Ayyad, male   DOB: 12/23/1980, 35 y.o.   MRN: [id number]  [facility]  1 Day Post-Op  Subjective: Pain well controlled. States that he tolerated meal from [employer] last night well, but was told he was NPO today. States that he is hungry. No BM but he is passing gas.  Objective: Vital signs in last 24 hours: Temp:  [97.4 F (36.3 C)-98.2 F (36.8 C)] 97.6 F (36.4 C) (08/17 0959) Pulse Rate:  [55-78] 60 (08/17 0959) Resp:  [14-19] 19 (08/17 0959) BP: (99-123)/(58-81) 99/58 (08/17 0959) SpO2:  [96 %-98 %] 98 % (08/17 0959) Last BM Date: 01/18/16  Intake/Output from previous day: 08/16 0701 - 08/17 0700 In: 5726.2 [P.O.:1720; I.V.:3806.2; IV Piggyback:200] Out: 1525 [Urine:1325; Blood:200] Intake/Output this shift: Total I/O In: 0  Out: 800 [Urine:800]  PE: Gen:  Alert, NAD, pleasant Card:  RRR Pulm:  CTAB Abd: Soft, NT/ND, +BS LLE: large proximal/medial thigh wound packed, no surrounding erythema.  Lab Results:   Recent Labs  01/19/16 0225 01/19/16 0801  WBC 10.0 8.2  HGB 13.8 13.1  HCT 41.3 39.5  PLT 299 300   BMET  Recent Labs  01/19/16 0225 01/19/16 0801  NA 134* 137  K 3.7 3.5  CL 100* 99*  CO2 24 28  GLUCOSE 103* 103*  BUN 8 8  CREATININE 0.88 0.88  CALCIUM 9.5 9.2   PT/INR No results for input(s): LABPROT, INR in the last 72 hours. CMP     Component Value Date/Time   NA 137 01/19/2016 0801   K 3.5 01/19/2016 0801   CL 99 (L) 01/19/2016 0801   CO2 28 01/19/2016 0801   GLUCOSE 103 (H) 01/19/2016 0801   BUN 8 01/19/2016 0801   CREATININE 0.88 01/19/2016 0801   CALCIUM 9.2 01/19/2016 0801   GFRNONAA >60 01/19/2016 0801   GFRAA >60 01/19/2016 0801   Lipase  No results found for: LIPASE     Studies/Results: No results found.  Anti-infectives: Anti-infectives    Start     Dose/Rate Route Frequency Ordered Stop   01/19/16 0700  piperacillin-tazobactam (ZOSYN) IVPB 3.375 g     3.375  g 12.5 mL/hr over 240 Minutes Intravenous Every 8 hours 01/19/16 0648     01/19/16 0230  ceFAZolin (ANCEF) IVPB 1 g/50 mL premix     1 g 100 mL/hr over 30 Minutes Intravenous  Once 01/19/16 0228 01/19/16 0413       Assessment/Plan S/p left thigh abscess I&D - POD1. Daily dressing changes with normal saline wet to drys. Pending surgical cultures. MRI with contrast ordered. ID - Zosyn FEN - advance diet as tolerated VTE - lovenox (patient refused this a.m.), SCD's Dispo- MRI    LOS: 1 day    Edson SnowballBROOKE A MILLER , [facility] 01/20/2016, 10:46 AM Pager: [phone] Consults: [phone] Mon-Fri 7:00 am-4:30 pm Sat-Sun 7:00 am-11:30 am

## 2016-01-21 ENCOUNTER — Inpatient Hospital Stay (HOSPITAL_COMMUNITY): Payer: Self-pay

## 2016-01-21 MED ORDER — TRAMADOL HCL 50 MG PO TABS
50.0000 mg | ORAL_TABLET | Freq: Four times a day (QID) | ORAL | Status: DC | PRN
  Administered 2016-01-23: 50 mg via ORAL
  Filled 2016-01-21: qty 1

## 2016-01-21 MED ORDER — GADOBENATE DIMEGLUMINE 529 MG/ML IV SOLN
20.0000 mL | Freq: Once | INTRAVENOUS | Status: AC | PRN
  Administered 2016-01-21: 19 mL via INTRAVENOUS

## 2016-01-21 NOTE — Progress Notes (Signed)
Patient ID: Tom RicherJonathan Paparella, male   DOB: 08/24/1980, 35 y.o.   MRN: [id number]  [facility]  2 Days Post-Op  Subjective: Doing well this morning. States that his wound is a little more painful currently. Dilaudid helps the pain but keeps him awake; has same problem with oxycodone. Tolerating meals and having regular Bm's. Has not yet had MRI. Anxious about going home because he has 2 large dogs - states that he needs to discuss discharge with girlfriend (whom he lives with).   Report of surgical pathology: 1. Soft tissue, abscess, Left Thigh Wound Mass - DENSELY INFLAMED FIBROADIPOSE TISSUE WITH DYSTROPHIC CALCIFICATIONS. - THERE IS NO EVIDENCE OF MALIGNANCY. 2. Skin , Left Thigh Wound Mass - DENSELY INFLAMED SKIN WITH DENSE DERMAL AND SUBCUTANEOUS INFLAMMATION, CONSISTENT WITH ABSCESS. - THERE IS NO EVIDENCE OF MALIGNANCY.  Objective: Vital signs in last 24 hours: Temp:  [98.1 F (36.7 C)-98.6 F (37 C)] 98.1 F (36.7 C) (08/18 1027) Pulse Rate:  [51-61] 59 (08/18 1027) Resp:  [18-20] 20 (08/18 1027) BP: (103-139)/(58-117) 107/63 (08/18 1027) SpO2:  [98 %-100 %] 99 % (08/18 1027) Last BM Date: 01/18/16  Intake/Output from previous day: 08/17 0701 - 08/18 0700 In: 3791.7 [P.O.:800; I.V.:2891.7; IV Piggyback:100] Out: 2450 [Urine:2450] Intake/Output this shift: Total I/O In: 240 [P.O.:240] Out: -   PE: Gen:  Alert, NAD, pleasant Card:  RRR Pulm:  CTAB Abd: Soft, NT/ND, +BS LLE: large proximal/medial thigh wound packed and dressed --- patient did not want me to remove dressing, states that his nurse will change dressing this afternoon and can notify me of any concerns  Lab Results:   Recent Labs  01/19/16 0225 01/19/16 0801  WBC 10.0 8.2  HGB 13.8 13.1  HCT 41.3 39.5  PLT 299 300   BMET  Recent Labs  01/19/16 0225 01/19/16 0801  NA 134* 137  K 3.7 3.5  CL 100* 99*  CO2 24 28  GLUCOSE 103* 103*  BUN 8 8  CREATININE 0.88  0.88  CALCIUM 9.5 9.2   PT/INR No results for input(s): LABPROT, INR in the last 72 hours. CMP     Component Value Date/Time   NA 137 01/19/2016 0801   K 3.5 01/19/2016 0801   CL 99 (L) 01/19/2016 0801   CO2 28 01/19/2016 0801   GLUCOSE 103 (H) 01/19/2016 0801   BUN 8 01/19/2016 0801   CREATININE 0.88 01/19/2016 0801   CALCIUM 9.2 01/19/2016 0801   GFRNONAA >60 01/19/2016 0801   GFRAA >60 01/19/2016 0801   Lipase  No results found for: LIPASE     Studies/Results: No results found.  Anti-infectives: Anti-infectives    Start     Dose/Rate Route Frequency Ordered Stop   01/19/16 0700  piperacillin-tazobactam (ZOSYN) IVPB 3.375 g     3.375 g 12.5 mL/hr over 240 Minutes Intravenous Every 8 hours 01/19/16 0648     01/19/16 0230  ceFAZolin (ANCEF) IVPB 1 g/50 mL premix     1 g 100 mL/hr over 30 Minutes Intravenous  Once 01/19/16 0228 01/19/16 0413       Assessment/Plan S/p left thigh abscess I&D - POD2. Continue daily dressing changes with normal saline wet to drys. Surgical pathology report showed no concern for malignancy, still recomend MRI with contrast. - pain controlled on dilaudid but this keeps him awake. Will trial tramadol after lunch to see if this could control his pain with fewer side effects ID - Zosyn. Will d/c on Doxycycline 100mg  BID  x10 days. FEN - regular diet.  VTE - lovenox (patient refused this a.m.), SCD's Dispo- patient will discuss d/c with girlfriend (whom he lives with), upon d/c will need HH nurse to help with dressing changes, f/u with Dr. Luisa Hartornett in 2-3 weeks, and MRI (if not done while in hospital)   LOS: 2 days    Edson SnowballBROOKE A MILLER , [facility] 01/21/2016, 12:05 PM Pager: [phone] Consults: [phone] Mon-Fri 7:00 am-4:30 pm Sat-Sun 7:00 am-11:30 am

## 2016-01-22 NOTE — Progress Notes (Signed)
Pt says he would rather have his dressing change done after he has eaten breakfast. He refused the the dressing change early in the morning at 5:30am. Will continue to monitor.

## 2016-01-22 NOTE — Progress Notes (Signed)
Patient ID: Tom Le, male   DOB: 01/28/1981, 35 y.o.   MRN: [id number] [facility] Progress Note:   3 Days Post-Op  Subjective: Mental status is alert but not a great historian Objective: Vital signs in last 24 hours: Temp:  [97.5 F (36.4 C)-99.1 F (37.3 C)] 98 F (36.7 C) (08/19 1037) Pulse Rate:  [52-69] 58 (08/19 1037) Resp:  [16-20] 20 (08/19 1037) BP: (99-124)/(54-71) 99/64 (08/19 1037) SpO2:  [97 %-98 %] 97 % (08/19 1037)  Intake/Output from previous day: 08/18 0701 - 08/19 0700 In: 640 [P.O.:640] Out: -  Intake/Output this shift: No intake/output data recorded.  Physical Exam: Work of breathing is normal.  Left leg is dressed and is covered in briefs and pants which he was reluctant to shed.  MRI suggesting of lipoma or possible liposarcoma.    Lab Results:  No results found for this or any previous visit (from the past 48 hour(s)).  Radiology/Results: Mr Femur Left W Wo Contrast  Result Date: 01/22/2016 CLINICAL DATA:  35 year old with enlarging medial left thigh mass for greater than 1 year. Patient underwent irrigation and debridement of a presumed abscess demonstrating a necrotic mass on 01/19/2016. Biopsies to date are negative for malignancy. EXAM: MR OF THE LEFT LOWER EXTREMITY WITHOUT AND WITH CONTRAST TECHNIQUE: Multiplanar, multisequence MR imaging of the lower left extremity was performed both before and after administration of intravenous contrast. CONTRAST:  19mL MULTIHANCE GADOBENATE DIMEGLUMINE 529 MG/ML IV SOLN COMPARISON:  None. FINDINGS: Examination was performed using the body coil. Both thighs are included on the coronal and axial images. Bones/Joint/Cartilage Both femurs appear normal without evidence of acute fracture, dislocation or bone destruction. There is no marrow edema or abnormal enhancement. The knees and hips are incompletely visualized. Ligaments Not relevant for exam/indication. Muscles and Tendons There is a large, complex  mass medially in the proximal left thigh, the epicenter of which is in the left gracilis muscle. The proximal and distal components of this lesion demonstrate normal fat signal and extend approximately 13.9 cm on coronal image 15 of series 8. More centrally within this lesion, there is a more heterogeneous component which measures 9.2 x 6.7 x 9.9 cm. This component has a peripheral rim of low signal on all pulse sequences. Centrally, the signal is nearly isointense to fat on all sequences, although mildly heterogeneous. No appreciable central enhancement of this lesion is identified, although unfortunately, the parameters were slightly altered between the pre and postcontrast images. Along the inferior medial aspect of this mass, there are irregular fluid collections, edema and subcutaneous enhancement within the surgical bed which may be postsurgical and/or inflammatory. A 4.6 cm fluid collection along the inferior aspect of the mass (axial image 27 of series 4) demonstrates intermediate T1 signal and no suspicious enhancement. Soft tissues Open surgical incision is noted overlying the mass as described above. As above, there are inflammatory changes with heterogeneous fluid collections and enhancement within the subcutaneous fat around the surgical bed. There is a subcutaneous collection just superior to the incision which measures up to 8.8 x 3.3 cm transverse. No distant masses or fluid collections are identified. No significant findings are seen within the right thigh. IMPRESSION: 1. 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 Peripheral rim of low signal may represent fibrosis or calcification. A low-grade liposarcoma cannot be excluded, and surgical excision of this lesion should be considered. 2. Surrounding inflammatory changes, ill-defined fluid collections and enhancement are  nonspecific given the interval surgery. Findings could be secondary to  superimposed infection and/or hemorrhage. Electronically Signed   By: Carey BullocksWilliam  Veazey M.D.   On: 01/22/2016 09:09    Anti-infectives: Anti-infectives    Start     Dose/Rate Route Frequency Ordered Stop   01/19/16 0700  piperacillin-tazobactam (ZOSYN) IVPB 3.375 g     3.375 g 12.5 mL/hr over 240 Minutes Intravenous Every 8 hours 01/19/16 0648     01/19/16 0230  ceFAZolin (ANCEF) IVPB 1 g/50 mL premix     1 g 100 mL/hr over 30 Minutes Intravenous  Once 01/19/16 0228 01/19/16 0413      Assessment/Plan: Problem List: Patient Active Problem List   Diagnosis Date Noted  . Abscess of left thigh 01/19/2016    Will need discussion and planning for likely return to the OR to remove this mass that has been there for greater than a year.   3 Days Post-Op    LOS: 3 days   Matt B. Daphine DeutscherMartin, MD, [facility]  [facility] [phone] beeper [phone]  01/22/2016 12:35 PM

## 2016-01-23 MED ORDER — DOXYCYCLINE HYCLATE 100 MG PO TABS
100.0000 mg | ORAL_TABLET | Freq: Two times a day (BID) | ORAL | Status: DC
  Administered 2016-01-23 – 2016-01-24 (×3): 100 mg via ORAL
  Filled 2016-01-23 (×3): qty 1

## 2016-01-23 NOTE — Progress Notes (Signed)
4 Days Post-Op  Subjective: Seen removed after some time. Open site looks good. Very tender to about an hour to get his dressing down and look at it.  Objective: Vital signs in last 24 hours: Temp:  [98 F (36.7 C)-99 F (37.2 C)] 98.6 F (37 C) (08/20 0523) Pulse Rate:  [51-65] 65 (08/20 0523) Resp:  [18-22] 19 (08/20 0523) BP: (99-121)/(52-76) 118/76 (08/20 0523) SpO2:  [96 %-100 %] 99 % (08/20 0523) Last BM Date: 01/22/16 Voided x 1 recorded??? Afebrile, VSS No labs MRI 01/22/16:  1. 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 Peripheral rim of low signal may represent fibrosis or calcification. A low-grade liposarcoma cannot be excluded, and surgical excision of this lesion should be considered.  POst op/infection changes  Intake/Output from previous day: 08/19 0701 - 08/20 0700 In: 2710 [P.O.:960; I.V.:1500; IV Piggyback:250] Out: -  Intake/Output this shift: No intake/output data recorded.  General appearance: alert, cooperative and anxious Skin: open site looks fine.  some bleeding from open site with removal of dressing.  .    Lab Results:  No results for input(s): WBC, HGB, HCT, PLT in the last 72 hours.  BMET No results for input(s): NA, K, CL, CO2, GLUCOSE, BUN, CREATININE, CALCIUM in the last 72 hours. PT/INR No results for input(s): LABPROT, INR in the last 72 hours.  No results for input(s): AST, ALT, ALKPHOS, BILITOT, PROT, ALBUMIN in the last 168 hours.   Lipase  No results found for: LIPASE   Studies/Results: Mr Femur Left W Wo Contrast  Result Date: 01/22/2016 CLINICAL DATA:  35 year old with enlarging medial left thigh mass for greater than 1 year. Patient underwent irrigation and debridement of a presumed abscess demonstrating a necrotic mass on 01/19/2016. Biopsies to date are negative for malignancy. EXAM: MR OF THE LEFT LOWER EXTREMITY WITHOUT AND WITH CONTRAST TECHNIQUE: Multiplanar,  multisequence MR imaging of the lower left extremity was performed both before and after administration of intravenous contrast. CONTRAST:  19mL MULTIHANCE GADOBENATE DIMEGLUMINE 529 MG/ML IV SOLN COMPARISON:  None. FINDINGS: Examination was performed using the body coil. Both thighs are included on the coronal and axial images. Bones/Joint/Cartilage Both femurs appear normal without evidence of acute fracture, dislocation or bone destruction. There is no marrow edema or abnormal enhancement. The knees and hips are incompletely visualized. Ligaments Not relevant for exam/indication. Muscles and Tendons There is a large, complex mass medially in the proximal left thigh, the epicenter of which is in the left gracilis muscle. The proximal and distal components of this lesion demonstrate normal fat signal and extend approximately 13.9 cm on coronal image 15 of series 8. More centrally within this lesion, there is a more heterogeneous component which measures 9.2 x 6.7 x 9.9 cm. This component has a peripheral rim of low signal on all pulse sequences. Centrally, the signal is nearly isointense to fat on all sequences, although mildly heterogeneous. No appreciable central enhancement of this lesion is identified, although unfortunately, the parameters were slightly altered between the pre and postcontrast images. Along the inferior medial aspect of this mass, there are irregular fluid collections, edema and subcutaneous enhancement within the surgical bed which may be postsurgical and/or inflammatory. A 4.6 cm fluid collection along the inferior aspect of the mass (axial image 27 of series 4) demonstrates intermediate T1 signal and no suspicious enhancement. Soft tissues Open surgical incision is noted overlying the mass as described above. As above, there are inflammatory  changes with heterogeneous fluid collections and enhancement within the subcutaneous fat around the surgical bed. There is a subcutaneous collection  just superior to the incision which measures up to 8.8 x 3.3 cm transverse. No distant masses or fluid collections are identified. No significant findings are seen within the right thigh. IMPRESSION: 1. 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 Peripheral rim of low signal may represent fibrosis or calcification. A low-grade liposarcoma cannot be excluded, and surgical excision of this lesion should be considered. 2. Surrounding inflammatory changes, ill-defined fluid collections and enhancement are nonspecific given the interval surgery. Findings could be secondary to superimposed infection and/or hemorrhage. Electronically Signed   By: Carey BullocksWilliam  Veazey M.D.   On: 01/22/2016 09:09   Prior to Admission medications   Not on File    . 0.9 % NaCl with KCl 20 mEq / L 125 mL/hr at 01/23/16 0647   Medications: . Chlorhexidine Gluconate Cloth  6 each Topical Once  . enoxaparin (LOVENOX) injection  40 mg Subcutaneous Q24H  . piperacillin-tazobactam (ZOSYN)  IV  3.375 g Intravenous Q8H   1. Soft tissue, abscess, Left Thigh Wound Mass - DENSELY INFLAMED FIBROADIPOSE TISSUE WITH DYSTROPHIC CALCIFICATIONS. - THERE IS NO EVIDENCE OF MALIGNANCY. 2. Skin , Left Thigh Wound Mass - DENSELY INFLAMED SKIN WITH DENSE DERMAL AND SUBCUTANEOUS INFLAMMATION, CONSISTENT WITH ABSCESS. - THERE IS NO EVIDENCE OF MALIGNANCY.   Assessment/Plan Enlarging Left upper thigh mass, 10 x 15 cm with abscess posterior compartment. 1 year S/p Incision and drainage of left upper thigh  mass with biopsy of large necrotic left upper posterior medial thigh mass   10 x 15 cm, 01/19/16, Dr. Luisa Hartornett  POD 4 No growth on cultures so far FEN: regular diet + IV fluids ID: day 5 Zosyn DVT:  Lovenox  Plan:  Need to discuss with Dr. Luisa Hartornett on Monday, plan for removal.  He would like it done now.      LOS: 4 days    Blakley Michna 01/23/2016 [phone]

## 2016-01-24 LAB — CULTURE, BLOOD (ROUTINE X 2)
Culture: NO GROWTH
Culture: NO GROWTH

## 2016-01-24 LAB — AEROBIC/ANAEROBIC CULTURE (SURGICAL/DEEP WOUND): CULTURE: NO GROWTH

## 2016-01-24 LAB — AEROBIC/ANAEROBIC CULTURE W GRAM STAIN (SURGICAL/DEEP WOUND)

## 2016-01-24 MED ORDER — TRAMADOL HCL 50 MG PO TABS: 50.0000 mg | ORAL_TABLET | Freq: Four times a day (QID) | ORAL | 0 refills | Status: DC | PRN

## 2016-01-24 MED ORDER — OXYCODONE-ACETAMINOPHEN 5-325 MG PO TABS: 1.0000 | ORAL_TABLET | ORAL | 0 refills | Status: DC | PRN

## 2016-01-24 MED ORDER — DOXYCYCLINE HYCLATE 100 MG PO TABS: 100.0000 mg | ORAL_TABLET | Freq: Two times a day (BID) | ORAL | 0 refills | Status: AC

## 2016-01-24 NOTE — Care Management Note (Signed)
Case Management Note  Patient Details  Name: Tom RicherJonathan Le MRN: [id number] Date of Birth: 07/05/1980  Subjective/Objective:                 Independent 35 year old male from home, lives with GF. BID wet to dry dressing changes to thigh, states he and GF can do dressing changes. Patient is not home bound and would not qualify [facility]. Patient provided with coupon for doxy, CM cannot provide financial assistance for narcotics.   Action/Plan:  DC to home self care. Expected Discharge Date:                  Expected Discharge Plan:  Home/Self Care  In-House Referral:  NA  Discharge planning Services  CM Consult, Medication Assistance  Post Acute Care Choice:  NA Choice offered to:  NA  DME Arranged:  N/A DME Agency:  NA  HH Arranged:  NA HH Agency:  NA  Status of Service:  Completed, signed off  If discussed at Long Length of Stay Meetings, dates discussed:    Additional Comments:  Lawerance SabalDebbie Chrystian Ressler, RN 01/24/2016, 12:27 PM

## 2016-01-24 NOTE — Discharge Summary (Signed)
[facility] Discharge Summary   Patient ID: Tom Le MRN: [id number] DOB/AGE: 33/06/1980 34 y.o.  Admit date: 01/18/2016 Discharge date: 01/24/2016  Admitting Diagnosis: Abscess of left thigh  Discharge Diagnosis Patient Active Problem List   Diagnosis Date Noted  . 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 Peripheral rim of low signal may represent fibrosis or calcification. A low-grade liposarcoma cannot be excluded, and surgical excision of this lesion should be considered. Surrounding inflammatory changes.  Procedures Dr. Maisie Fushomas Cornett (01/19/16) - Incision and drainage of left upper thigh mass with biopsy of large necrotic left upper posterior medial thigh mass 10 x 15 cm.  Hospital Course:  35 y/o male who [facility] with a large area of swelling, inflammation, and drainage from his left thigh. Per the patient he has had a small mass there for about a year but it has grown significantly over the past few weeks. Patient was admitted, started on IV antibiotics, and underwent procedure listed above.  Tolerated procedure well and was transferred to the floor where he received BID dressing changes following surgery. Ad MRI was ordered to further evaluate the mass of his left thigh - results as above. Diet was advanced as tolerated. On POD#5, the patient was voiding well, tolerating diet, ambulating well, pain well controlled, vital signs stable, wound c/d/i and felt stable for discharge home on PO doxycycline.  Patient will follow up in our office in 2 weeks and knows to call with questions or concerns. Wound cultures at the time of discharge were negative, and biopsy results pending.   Physical Exam: General:  Alert, NAD, pleasant, comfortable Pulm: non-labored, CTABL CV: RRR no m/r/g Abd:  Soft,  NT/ND, +BS     Medication List    TAKE these medications   doxycycline 100 MG tablet Commonly known as:  VIBRA-TABS Take 1 tablet (100 mg total) by mouth 2 (two) times daily.   oxyCODONE-acetaminophen 5-325 MG tablet Commonly known as:  PERCOCET/ROXICET Take 1-2 tablets by mouth every 4 (four) hours as needed for moderate pain.   traMADol 50 MG tablet Commonly known as:  ULTRAM Take 1 tablet (50 mg total) by mouth every 6 (six) hours as needed for moderate pain.      Follow-up Information    CORNETT,THOMAS A., MD. Nyra CapesGo on 02/10/2016.   Specialty:  General Surgery Why:  your appointment for post-operative follow-up is at 2:30PM. Please arrive by 2:00 PM to get checked in and fill out any necessary paperwork. Contact information: [address] [place] [address] [phone]           Signed: Hosie Spanglelizabeth Simaan, [facility] 01/24/2016, 1:23 PM Pager: [phone] Consults: [phone] Mon-Fri 7:00 am-4:30 pm Sat-Sun 7:00 am-11:30 am

## 2016-01-24 NOTE — Discharge Instructions (Signed)
Dressing Change °A dressing is a material placed over wounds. It keeps the wound clean, dry, and protected from further injury. This provides an environment that favors wound healing.  °BEFORE YOU BEGIN °· Get your supplies together. Things you may need include: °¨ Saline solution. °¨ Flexible gauze dressing. °¨ Medicated cream. °¨ Tape. °¨ Gloves. °¨ Abdominal dressing pads. °¨ Gauze squares. °¨ Plastic bags. °· Take pain medicine 30 minutes before the dressing change if you need it. °· Take a shower before you do the first dressing change of the day. Use plastic wrap or a plastic bag to prevent the dressing from getting wet. °REMOVING YOUR OLD DRESSING  °· Wash your hands with soap and water. Dry your hands with a clean towel. °· Put on your gloves. °· Remove any tape. °· Carefully remove the old dressing. If the dressing sticks, you may dampen it with warm water to loosen it, or follow your caregiver's specific directions. °· Remove any gauze or packing tape that is in your wound. °· Take off your gloves. °· Put the gloves, tape, gauze, or any packing tape into a plastic bag. °CHANGING YOUR DRESSING °· Open the supplies. °· Take the cap off the saline solution. °· Open the gauze package so that the gauze remains on the inside of the package. °· Put on your gloves. °· Clean your wound as told by your caregiver. °· If you have been told to keep your wound dry, follow those instructions. °· Your caregiver may tell you to do one or more of the following: °¨ Pick up the gauze. Pour the saline solution over the gauze. Squeeze out the extra saline solution. °¨ Put medicated cream or other medicine on your wound if you have been told to do so. °¨ Put the solution soaked gauze only in your wound, not on the skin around it. °¨ Pack your wound loosely or as told by your caregiver. °¨ Put dry gauze on your wound. °¨ Put abdominal dressing pads over the dry gauze if your wet gauze soaks through. °· Tape the abdominal dressing  pads in place so they will not fall off. Do not wrap the tape completely around the affected part (arm, leg, abdomen). °· Wrap the dressing pads with a flexible gauze dressing to secure it in place. °· Take off your gloves. Put them in the plastic bag with the old dressing. Tie the bag shut and throw it away. °· Keep the dressing clean and dry until your next dressing change. °· Wash your hands. °SEEK MEDICAL CARE IF: °· Your skin around the wound looks red. °· Your wound feels more tender or sore. °· You see pus in the wound. °· Your wound smells bad. °· You have a fever. °· Your skin around the wound has a rash that itches and burns. °· You see black or yellow skin in your wound that was not there before. °· You feel nauseous, throw up, and feel very tired. °  °This information is not intended to replace advice given to you by your health care provider. Make sure you discuss any questions you have with your health care provider. °  °Document Released: 06/29/2004 Document Revised: 08/14/2011 Document Reviewed: 04/03/2011 °Elsevier Interactive Patient Education ©2016 [employer] ° °

## 2016-01-24 NOTE — Progress Notes (Signed)
Patient was discharged home by MD order; discharged instructions  review and give to patient with care notes and prescriptions; IV DIC; dressing on left thigh was changed before discharge; patient will be escorted to the car by a volunteer via wheelchair.

## 2016-01-24 NOTE — Plan of Care (Signed)
Problem: Education: Goal: Knowledge of [facility] Education information/materials will improve Outcome: Progressing Patient wanted to change dressing after shower.  Patient was taking pain medication and in to much to perform shower or dressing change last night (pt.refused at that time).  Patient was resting with eyes closed the remainder of the shift.

## 2016-03-22 DIAGNOSIS — R2242 Localized swelling, mass and lump, left lower limb: Secondary | ICD-10-CM | POA: Insufficient documentation

## 2017-01-11 IMAGING — MR MR FEMUR*L* WO/W CM
4 of 9 series · 18 of 40 positions shown · IV contrast (20 MH)
Comparison: None.

CLINICAL DATA: 34-year-old with enlarging medial left thigh mass
for greater than 1 year. Patient underwent irrigation and
debridement of a presumed abscess demonstrating a necrotic mass on
01/19/2016. Biopsies to date are negative for malignancy.

EXAM:
MR OF THE LEFT LOWER EXTREMITY WITHOUT AND WITH CONTRAST
TECHNIQUE: Multiplanar, multisequence MR imaging of the lower left extremity
was performed both before and after administration of intravenous
contrast.
CONTRAST:  19mL MULTIHANCE GADOBENATE DIMEGLUMINE 529 MG/ML IV SOLN

[Series 3: T1 · axial · 6.0mm · 0.98mm/px · z∈[-221,+155]mm · 6 of 48 slices shown (1 of 2)]
[im 1/48]
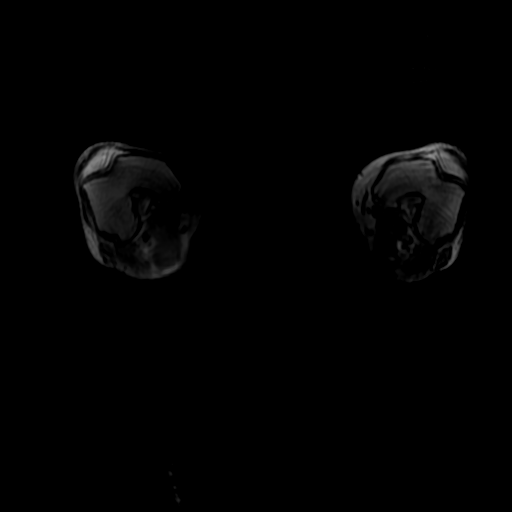
[im 10/48]
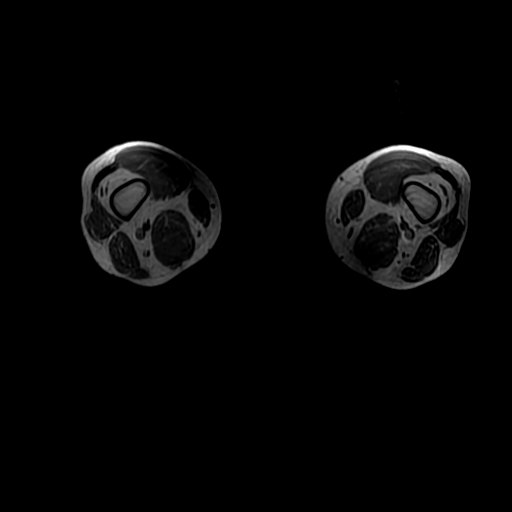
[im 19/48]
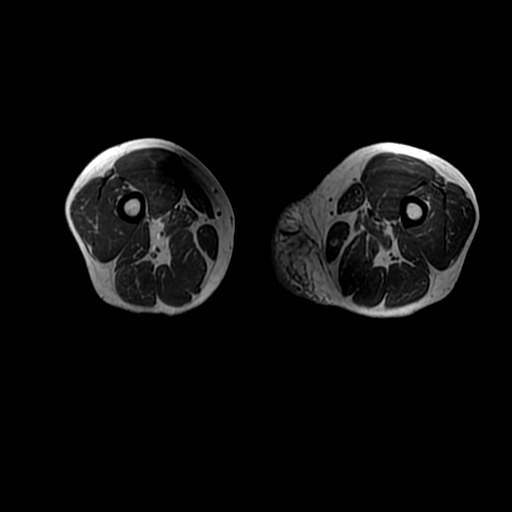
[im 29/48]
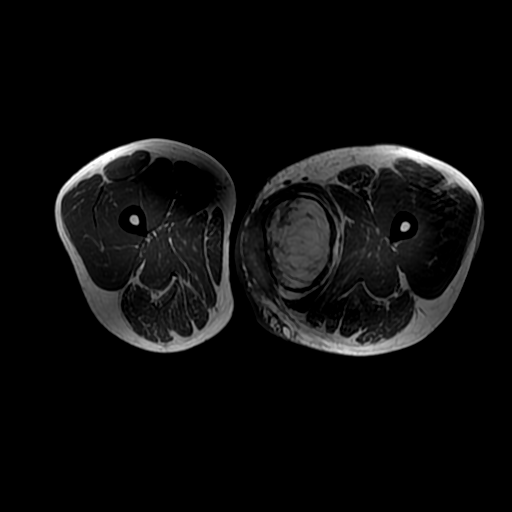
[im 38/48]
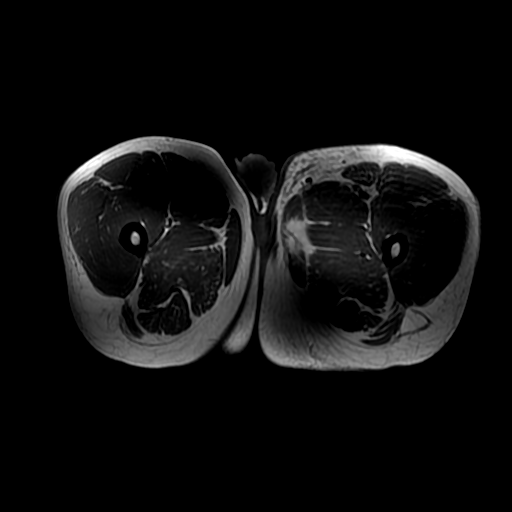
[im 48/48]
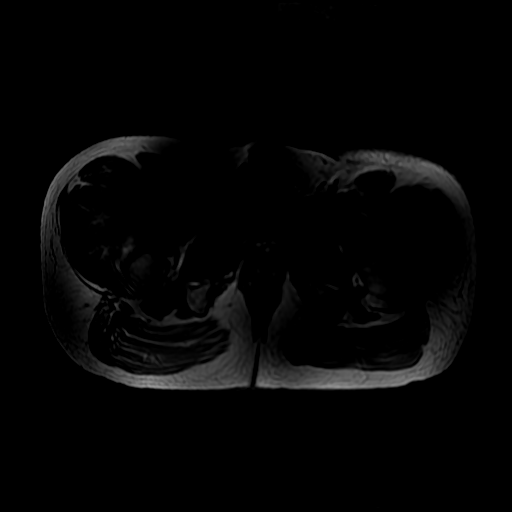

[Series 4: T2 fat-sat · axial · 6.0mm · 0.98mm/px · z∈[-221,+155]mm · 6 of 48 slices shown]
[im 1/48]
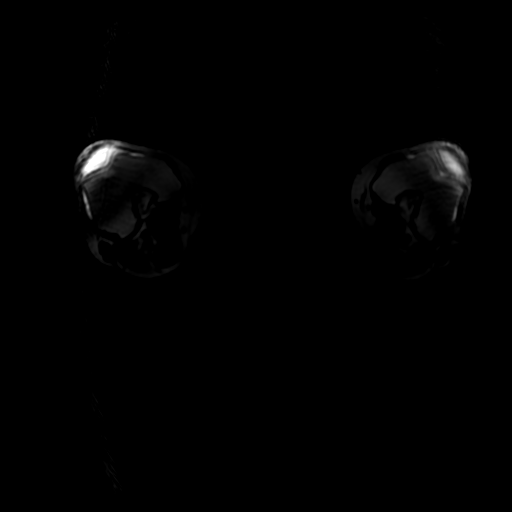
[im 10/48]
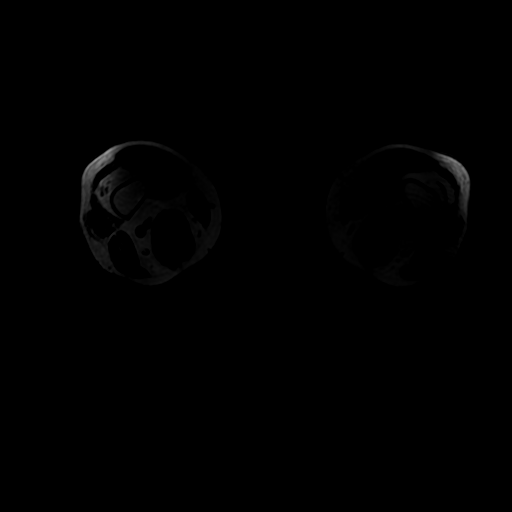
[im 19/48]
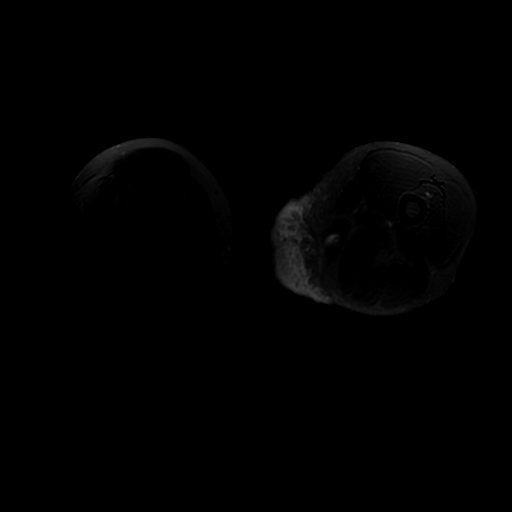
[im 29/48]
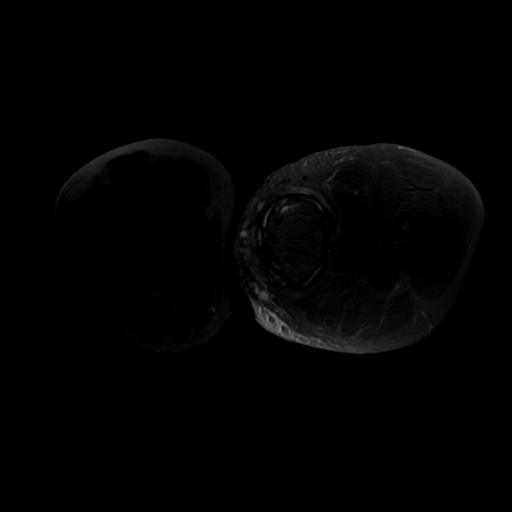
[im 38/48]
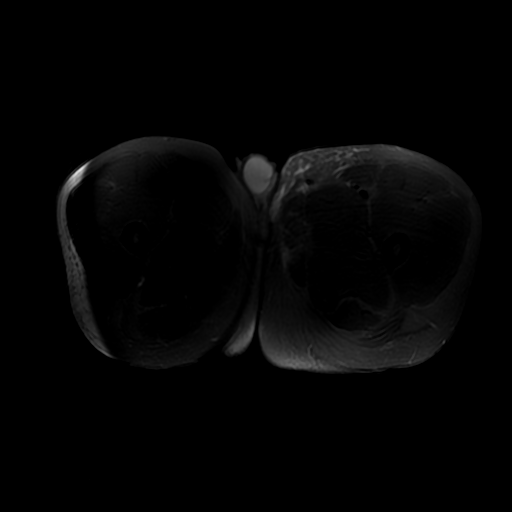
[im 48/48]
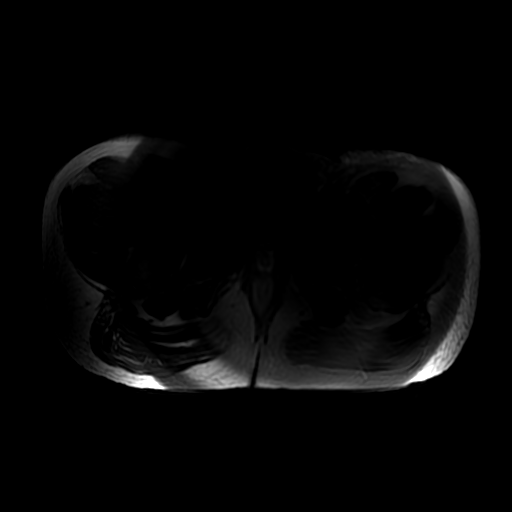

[Series 6: T1 fat-sat · axial · non-contrast · 6.0mm · 0.98mm/px · z∈[-221,+155]mm · 3 of 48 slices shown]
[im 1/48]
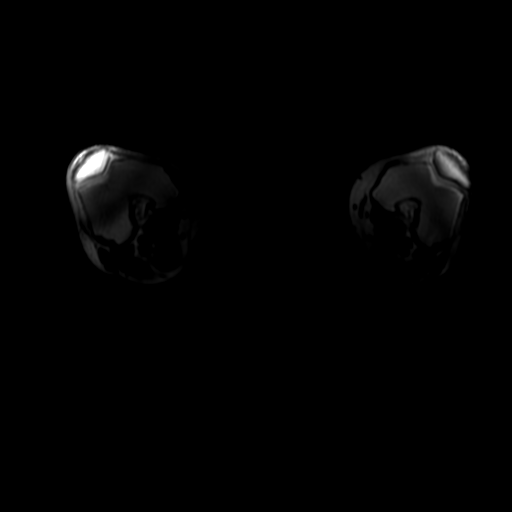
[im 24/48]
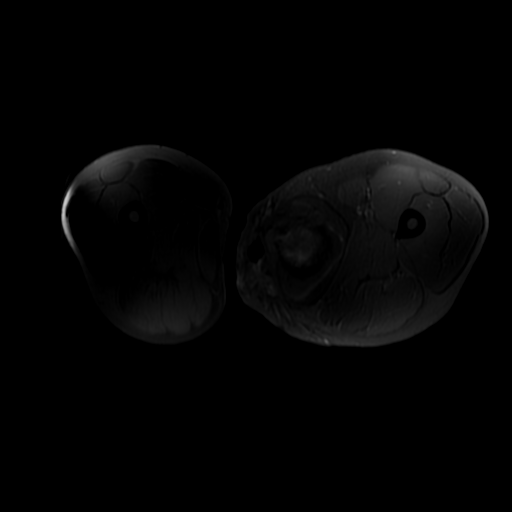
[im 48/48]
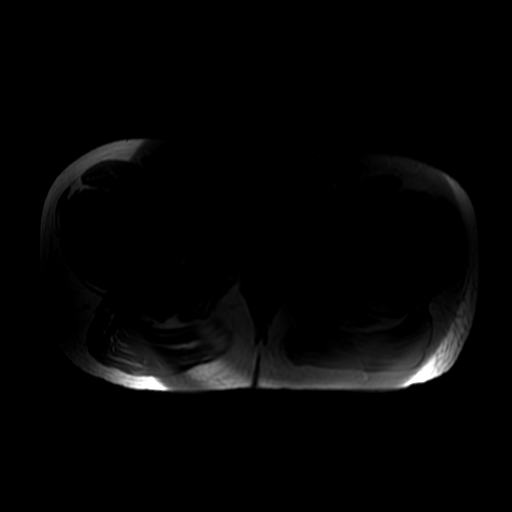

[Series 8: T1 · coronal · 6.0mm · 0.98mm/px · 3 of 32 slices shown (2 of 2)]
[im 1/32]
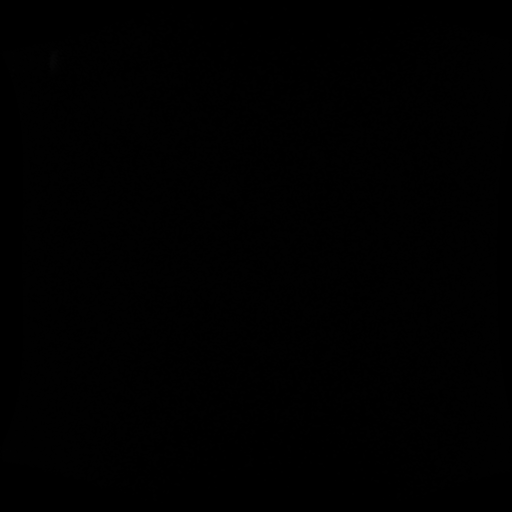
[im 16/32]
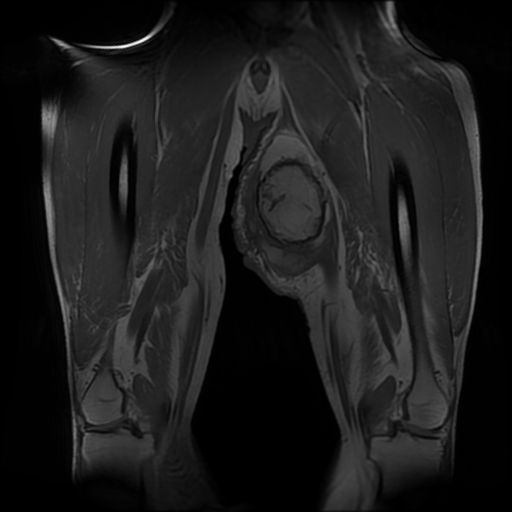
[im 32/32]
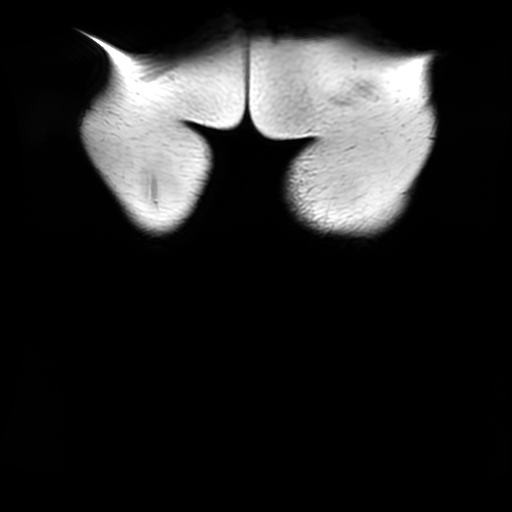

[18 of 40 positions shown; findings below may reference images not displayed]

FINDINGS: Examination was performed using the body coil. Both thighs are
included on the coronal and axial images.

Bones/Joint/Cartilage

Both femurs appear normal without evidence of acute fracture,
dislocation or bone destruction. There is no marrow edema or
abnormal enhancement. The knees and hips are incompletely
visualized.

Ligaments

Not relevant for exam/indication.

Muscles and Tendons
There is a large, complex mass medially in the proximal left thigh,
the epicenter of which is in the left gracilis muscle. The proximal
and distal components of this lesion demonstrate normal fat signal
and extend approximately 13.9 cm on coronal image 15 of series 8.
More centrally within this lesion, there is a more heterogeneous
component which measures 9.2 x 6.7 x 9.9 cm. This component has a
peripheral rim of low signal on all pulse sequences. Centrally, the
signal is nearly isointense to fat on all sequences, although mildly
heterogeneous. No appreciable central enhancement of this lesion is
identified, although unfortunately, the parameters were slightly
altered between the pre and postcontrast images. Along the inferior
medial aspect of this mass, there are irregular fluid collections,
edema and subcutaneous enhancement within the surgical bed which may
be postsurgical and/or inflammatory. A 4.6 cm fluid collection along
the inferior aspect of the mass (axial image 27 of series 4)
demonstrates intermediate T1 signal and no suspicious enhancement.

Soft tissues
Open surgical incision is noted overlying the mass as described
above. As above, there are inflammatory changes with heterogeneous
fluid collections and enhancement within the subcutaneous fat around
the surgical bed. There is a subcutaneous collection just superior
to the incision which measures up to 8.8 x 3.3 cm transverse. No
distant masses or fluid collections are identified. No significant
findings are seen within the right thigh.
IMPRESSION: 1. Complex mass within the left gracilis muscle is most likely a
lipoma with central fat necrosis. Peripheral rim of low signal may
represent fibrosis or calcification. A low-grade liposarcoma cannot
be excluded, and surgical excision of this lesion should be
considered.
2. Surrounding inflammatory changes, ill-defined fluid collections
and enhancement are nonspecific given the interval surgery. Findings
could be secondary to superimposed infection and/or hemorrhage.

## 2019-09-05 ENCOUNTER — Ambulatory Visit: Payer: Self-pay | Attending: Internal Medicine

## 2019-09-05 DIAGNOSIS — Z23 Encounter for immunization: Secondary | ICD-10-CM

## 2019-09-05 NOTE — Progress Notes (Signed)
   Covid-19 Vaccination Clinic  Name:  Tom Le    MRN: [id number] DOB: 08-Feb-1981  09/05/2019  Mr. Dressel was observed post Covid-19 immunization for 15 minutes without incident. He was provided with Vaccine Information Sheet and instruction to access the V-Safe system.   Mr. Danh was instructed to call 911 with any severe reactions post vaccine: Marland Kitchen Difficulty breathing  . Swelling of face and throat  . A fast heartbeat  . A bad rash all over body  . Dizziness and weakness   Immunizations Administered    Name Date Dose VIS Date Route   Pfizer COVID-19 Vaccine 09/05/2019  2:36 PM 0.3 mL 05/16/2019 Intramuscular   Manufacturer: [employer], [employer]   Lot: [id number]   NDC: [address]-9

## 2019-09-29 ENCOUNTER — Ambulatory Visit: Payer: Self-pay | Attending: Internal Medicine

## 2019-09-29 DIAGNOSIS — Z23 Encounter for immunization: Secondary | ICD-10-CM

## 2019-09-29 NOTE — Progress Notes (Signed)
   Covid-19 Vaccination Clinic  Name:  Tom Le    MRN: [id number] DOB: Feb 18, 1981  09/29/2019  [name] was observed post Covid-19 immunization for 15 minutes without incident. He was provided with Vaccine Information Sheet and instruction to access the V-Safe system.   [name] was instructed to call 911 with any severe reactions post vaccine: Marland Kitchen Difficulty breathing  . Swelling of face and throat  . A fast heartbeat  . A bad rash all over body  . Dizziness and weakness   Immunizations Administered    Name Date Dose VIS Date Route   Pfizer COVID-19 Vaccine 09/29/2019  2:23 PM 0.3 mL 07/30/2018 Intramuscular   Manufacturer: [employer], [employer]   Lot: [id number]   NDC: [address]-9

## 2023-01-18 ENCOUNTER — Encounter (HOSPITAL_COMMUNITY): Payer: Self-pay

## 2023-01-18 ENCOUNTER — Inpatient Hospital Stay (HOSPITAL_COMMUNITY)
Admission: EM | Admit: 2023-01-18 | Discharge: 2023-01-22 | DRG: 392 | Disposition: A | Discharge status: Home / Self Care | Payer: Medicaid Other | Attending: Family Medicine | Admitting: Family Medicine

## 2023-01-18 ENCOUNTER — Other Ambulatory Visit: Payer: Self-pay

## 2023-01-18 ENCOUNTER — Ambulatory Visit (HOSPITAL_COMMUNITY)
Admission: EM | Admit: 2023-01-18 | Discharge: 2023-01-18 | Disposition: A | Discharge status: Home / Self Care | Payer: Medicaid Other | Attending: Emergency Medicine | Admitting: Emergency Medicine

## 2023-01-18 ENCOUNTER — Encounter (HOSPITAL_COMMUNITY): Payer: Self-pay | Admitting: *Deleted

## 2023-01-18 ENCOUNTER — Ambulatory Visit (INDEPENDENT_AMBULATORY_CARE_PROVIDER_SITE_OTHER): Payer: Medicaid Other

## 2023-01-18 DIAGNOSIS — E876 Hypokalemia: Secondary | ICD-10-CM | POA: Diagnosis present

## 2023-01-18 DIAGNOSIS — R1032 Left lower quadrant pain: Secondary | ICD-10-CM | POA: Diagnosis not present

## 2023-01-18 DIAGNOSIS — K5792 Diverticulitis of intestine, part unspecified, without perforation or abscess without bleeding: Principal | ICD-10-CM

## 2023-01-18 DIAGNOSIS — Z79899 Other long term (current) drug therapy: Secondary | ICD-10-CM

## 2023-01-18 DIAGNOSIS — Z6831 Body mass index (BMI) 31.0-31.9, adult: Secondary | ICD-10-CM

## 2023-01-18 DIAGNOSIS — E669 Obesity, unspecified: Secondary | ICD-10-CM | POA: Diagnosis present

## 2023-01-18 DIAGNOSIS — R17 Unspecified jaundice: Secondary | ICD-10-CM | POA: Diagnosis present

## 2023-01-18 DIAGNOSIS — K5720 Diverticulitis of large intestine with perforation and abscess without bleeding: Principal | ICD-10-CM | POA: Diagnosis present

## 2023-01-18 DIAGNOSIS — Z87891 Personal history of nicotine dependence: Secondary | ICD-10-CM

## 2023-01-18 LAB — CBC WITH DIFFERENTIAL/PLATELET
Abs Immature Granulocytes: 0.03 10*3/uL (ref 0.00–0.07)
Basophils Absolute: 0 10*3/uL (ref 0.0–0.1)
Basophils Relative: 0 %
Eosinophils Absolute: 0 10*3/uL (ref 0.0–0.5)
Eosinophils Relative: 0 %
HCT: 41.6 % (ref 39.0–52.0)
Hemoglobin: 13.9 g/dL (ref 13.0–17.0)
Immature Granulocytes: 0 %
Lymphocytes Relative: 9 %
Lymphs Abs: 1.2 10*3/uL (ref 0.7–4.0)
MCH: 30.5 pg (ref 26.0–34.0)
MCHC: 33.4 g/dL (ref 30.0–36.0)
MCV: 91.2 fL (ref 80.0–100.0)
Monocytes Absolute: 1.1 10*3/uL — ABNORMAL HIGH (ref 0.1–1.0)
Monocytes Relative: 9 %
Neutro Abs: 11 10*3/uL — ABNORMAL HIGH (ref 1.7–7.7)
Neutrophils Relative %: 82 %
Platelets: 266 10*3/uL (ref 150–400)
RBC: 4.56 MIL/uL (ref 4.22–5.81)
RDW: 12.6 % (ref 11.5–15.5)
WBC: 13.5 10*3/uL — ABNORMAL HIGH (ref 4.0–10.5)
nRBC: 0 % (ref 0.0–0.2)

## 2023-01-18 LAB — URINALYSIS, ROUTINE W REFLEX MICROSCOPIC
Bilirubin Urine: NEGATIVE
Glucose, UA: NEGATIVE mg/dL
Hgb urine dipstick: NEGATIVE
Ketones, ur: 5 mg/dL — AB
Leukocytes,Ua: NEGATIVE
Nitrite: NEGATIVE
Protein, ur: NEGATIVE mg/dL
Specific Gravity, Urine: 1.026 (ref 1.005–1.030)
pH: 5 (ref 5.0–8.0)

## 2023-01-18 LAB — COMPREHENSIVE METABOLIC PANEL
ALT: 22 U/L (ref 0–44)
AST: 20 U/L (ref 15–41)
Albumin: 4.2 g/dL (ref 3.5–5.0)
Alkaline Phosphatase: 66 U/L (ref 38–126)
Anion gap: 10 (ref 5–15)
BUN: 13 mg/dL (ref 6–20)
CO2: 21 mmol/L — ABNORMAL LOW (ref 22–32)
Calcium: 9 mg/dL (ref 8.9–10.3)
Chloride: 104 mmol/L (ref 98–111)
Creatinine, Ser: 0.86 mg/dL (ref 0.61–1.24)
GFR, Estimated: >60 mL/min (ref >60)
Glucose, Bld: 118 mg/dL — ABNORMAL HIGH (ref 70–99)
Potassium: 3.2 mmol/L — ABNORMAL LOW (ref 3.5–5.1)
Sodium: 135 mmol/L (ref 135–145)
Total Bilirubin: 1.8 mg/dL — ABNORMAL HIGH (ref 0.3–1.2)
Total Protein: 7.7 g/dL (ref 6.5–8.1)

## 2023-01-18 LAB — POCT URINALYSIS DIP (MANUAL ENTRY)
Bilirubin, UA: NEGATIVE
Glucose, UA: NEGATIVE mg/dL
Leukocytes, UA: NEGATIVE
Nitrite, UA: NEGATIVE
Protein Ur, POC: NEGATIVE mg/dL
Spec Grav, UA: >=1.03 — AB (ref 1.010–1.025)
Urobilinogen, UA: 0.2 E.U./dL
pH, UA: 6 (ref 5.0–8.0)

## 2023-01-18 MED ORDER — ACETAMINOPHEN 325 MG PO TABS
650.0000 mg | ORAL_TABLET | Freq: Once | ORAL | Status: AC | PRN
  Administered 2023-01-18: 650 mg via ORAL
  Filled 2023-01-18: qty 2

## 2023-01-18 MED ORDER — TAMSULOSIN HCL 0.4 MG PO CAPS: 0.4000 mg | ORAL_CAPSULE | Freq: Every day | ORAL | 0 refills | Status: AC

## 2023-01-18 NOTE — ED Triage Notes (Signed)
Left flank pain beginning this morning with chills and suspected fevers.  Went to UC and had UA/ XR completed.   Given Rx for Tamsulosin but has not picked it up yet.

## 2023-01-18 NOTE — Discharge Instructions (Addendum)
Discussed with patient the x-ray results will show on MyChart this may or may not show or be indicative of kidney stones.  Patient will need to follow-up with urology for CT scans and further testing .  Plenty of fluids stay hydrated with water Pain medicine as needed Symptoms persist and pain is not controlled patient will need to follow-up in the emergency room

## 2023-01-18 NOTE — ED Triage Notes (Signed)
Pt states he woke up at 4am with LLQ pain. He did urinate at that time but the pain hasn't resolved.

## 2023-01-18 NOTE — ED Provider Notes (Signed)
[facility]    CSN: [id number] Arrival date & time: 01/18/23  1136      History   Chief Complaint Chief Complaint  Patient presents with   Abdominal Pain    HPI Tom Le is a 42 y.o. male.   Patient presents today with a sudden onset of left lower quadrant and left lower flank pain this a.m. at 4 AM after voided had some mild burning and discomfort.  Patient states that the pain is still present.  Had some chills but unknown fever.  States that he does have some frequency.  Denies any history of kidney stones but was concerned due to his symptoms mimicked this.  Last bowel movement was normal this a.m. movement made the pain worse sitting down helped to subside some of the pain.  Has not take anything prior to arrival    History reviewed. No pertinent past medical history.  Patient Active Problem List   Diagnosis Date Noted   Abscess of left thigh 01/19/2016    Past Surgical History:  Procedure Laterality Date   INCISION AND DRAINAGE ABSCESS Left 01/19/2016   Procedure: INCISION AND DRAINAGE ABSCESS LEFT THIGH;  Surgeon: Harriette Bouillon, MD;  Location: [facility] OR;  Service: General;  Laterality: Left;       Home Medications    Prior to Admission medications   Medication Sig Start Date End Date Taking? Authorizing Provider  tamsulosin (FLOMAX) 0.4 MG CAPS capsule Take 1 capsule (0.4 mg total) by mouth daily. 01/18/23  Yes Coralyn Mark, NP  oxyCODONE-acetaminophen (PERCOCET/ROXICET) 5-325 MG tablet Take 1-2 tablets by mouth every 4 (four) hours as needed for moderate pain. 01/24/16   Adam Phenix, PA-C  traMADol (ULTRAM) 50 MG tablet Take 1 tablet (50 mg total) by mouth every 6 (six) hours as needed for moderate pain. 01/24/16   Adam Phenix, PA-C    Family History History reviewed. No pertinent family history.  Social History Social History   Tobacco Use   Smoking status: Former    Current packs/day: 1.00    Types: Cigarettes    Smokeless tobacco: Never  Vaping Use   Vaping status: Every Day  Substance Use Topics   Alcohol use: Yes    Comment: beer and liquor almost every day   Drug use: Yes    Frequency: 2.0 times per week    Types: Marijuana     Allergies   Patient has no known allergies.   Review of Systems Review of Systems  Constitutional:  Positive for chills.  Respiratory: Negative.    Cardiovascular: Negative.   Gastrointestinal:  Positive for abdominal pain and nausea. Negative for blood in stool, constipation and vomiting.       Left flank pain  Genitourinary:  Positive for frequency. Negative for penile discharge, penile pain, penile swelling, scrotal swelling and testicular pain.     Physical Exam Triage Vital Signs ED Triage Vitals  Encounter Vitals Group     BP 01/18/23 1240 113/71     Systolic BP Percentile --      Diastolic BP Percentile --      Pulse Rate 01/18/23 1240 60     Resp 01/18/23 1240 18     Temp 01/18/23 1240 98.4 F (36.9 C)     Temp Source 01/18/23 1240 Oral     SpO2 01/18/23 1240 96 %     Weight --      Height --      Head Circumference --  Peak Flow --      Pain Score 01/18/23 1238 8     Pain Loc --      Pain Education --      Exclude from Growth Chart --    No data found.  Updated Vital Signs BP 113/71 (BP Location: Left Arm)   Pulse 60   Temp 98.4 F (36.9 C) (Oral)   Resp 18   SpO2 96%   Visual Acuity Right Eye Distance:   Left Eye Distance:   Bilateral Distance:    Right Eye Near:   Left Eye Near:    Bilateral Near:     Physical Exam Constitutional:      Appearance: He is well-developed.  Cardiovascular:     Rate and Rhythm: Normal rate.  Pulmonary:     Effort: Pulmonary effort is normal.  Abdominal:     General: Bowel sounds are normal. There is distension.     Palpations: Abdomen is soft.     Tenderness: There is abdominal tenderness in the left lower quadrant. There is left CVA tenderness. There is no right CVA  tenderness.     Comments: Left lower quadrant tender to palpation slight distention  Skin:    General: Skin is warm.  Neurological:     Mental Status: He is alert.      UC Treatments / Results  Labs (all labs ordered are listed, but only abnormal results are displayed) Labs Reviewed  POCT URINALYSIS DIP (MANUAL ENTRY) - Abnormal; Notable for the following components:      Result Value   Ketones, POC UA trace (5) (*)    Spec Grav, UA >=1.030 (*)    Blood, UA trace-intact (*)    All other components within normal limits    EKG   Radiology No results found.  Procedures Procedures (including critical care time)  Medications Ordered in UC Medications - No data to display  Initial Impression / Assessment and Plan / UC Course  I have reviewed the triage vital signs and the nursing notes.  Pertinent labs & imaging results that were available during my care of the patient were reviewed by me and considered in my medical decision making (see chart for details).     Patient will need to follow-up with urology possible CT scan or ultrasound to rule out kidney stones Take pain medicine as needed Stay hydrated drink plenty of fluids Discussed with patient the x-ray results will show on MyChart this may or may not show or be indicative of kidney stones.  Patient will need to follow-up with urology for CT scans and further testing  Pain medicine as needed Symptoms persist and pain is not controlled patient will need to follow-up in the emergency room  Final Clinical Impressions(s) / UC Diagnoses   Final diagnoses:  Left lower quadrant abdominal pain     Discharge Instructions      Discussed with patient the x-ray results will show on MyChart this may or may not show or be indicative of kidney stones.  Patient will need to follow-up with urology for CT scans and further testing .  Plenty of fluids stay hydrated with water Pain medicine as needed Symptoms persist and pain  is not controlled patient will need to follow-up in the emergency room     ED Prescriptions     Medication Sig Dispense Auth. Provider   tamsulosin (FLOMAX) 0.4 MG CAPS capsule Take 1 capsule (0.4 mg total) by mouth daily. 30 capsule Maple Mirza  L, NP      PDMP not reviewed this encounter.   Coralyn Mark, NP 01/18/23 1415

## 2023-01-19 ENCOUNTER — Emergency Department (HOSPITAL_COMMUNITY): Payer: Medicaid Other

## 2023-01-19 ENCOUNTER — Encounter (HOSPITAL_COMMUNITY): Payer: Self-pay

## 2023-01-19 DIAGNOSIS — Z6831 Body mass index (BMI) 31.0-31.9, adult: Secondary | ICD-10-CM | POA: Diagnosis not present

## 2023-01-19 DIAGNOSIS — Z87891 Personal history of nicotine dependence: Secondary | ICD-10-CM | POA: Diagnosis not present

## 2023-01-19 DIAGNOSIS — Z79899 Other long term (current) drug therapy: Secondary | ICD-10-CM | POA: Diagnosis not present

## 2023-01-19 DIAGNOSIS — E876 Hypokalemia: Secondary | ICD-10-CM | POA: Diagnosis present

## 2023-01-19 DIAGNOSIS — K5720 Diverticulitis of large intestine with perforation and abscess without bleeding: Secondary | ICD-10-CM | POA: Diagnosis present

## 2023-01-19 DIAGNOSIS — E669 Obesity, unspecified: Secondary | ICD-10-CM | POA: Diagnosis present

## 2023-01-19 DIAGNOSIS — R17 Unspecified jaundice: Secondary | ICD-10-CM | POA: Diagnosis present

## 2023-01-19 LAB — BASIC METABOLIC PANEL
Anion gap: 9 (ref 5–15)
BUN: 11 mg/dL (ref 6–20)
CO2: 22 mmol/L (ref 22–32)
Calcium: 8.3 mg/dL — ABNORMAL LOW (ref 8.9–10.3)
Chloride: 102 mmol/L (ref 98–111)
Creatinine, Ser: 0.77 mg/dL (ref 0.61–1.24)
GFR, Estimated: >60 mL/min (ref >60)
Glucose, Bld: 108 mg/dL — ABNORMAL HIGH (ref 70–99)
Potassium: 3.3 mmol/L — ABNORMAL LOW (ref 3.5–5.1)
Sodium: 133 mmol/L — ABNORMAL LOW (ref 135–145)

## 2023-01-19 LAB — HIV ANTIBODY (ROUTINE TESTING W REFLEX): HIV Screen 4th Generation wRfx: NONREACTIVE

## 2023-01-19 LAB — I-STAT CG4 LACTIC ACID, ED: Lactic Acid, Venous: <0.3 mmol/L — ABNORMAL LOW (ref 0.5–1.9)

## 2023-01-19 MED ORDER — HYDROMORPHONE HCL 1 MG/ML IJ SOLN
1.0000 mg | Freq: Once | INTRAMUSCULAR | Status: AC
  Administered 2023-01-19: 1 mg via INTRAVENOUS
  Filled 2023-01-19: qty 1

## 2023-01-19 MED ORDER — MENTHOL 3 MG MT LOZG: 1.0000 | LOZENGE | OROMUCOSAL | Status: DC | PRN

## 2023-01-19 MED ORDER — HYDROMORPHONE HCL 1 MG/ML IJ SOLN
0.5000 mg | INTRAMUSCULAR | Status: DC | PRN
  Administered 2023-01-19: 1 mg via INTRAVENOUS
  Filled 2023-01-19: qty 1

## 2023-01-19 MED ORDER — PIPERACILLIN-TAZOBACTAM 3.375 G IVPB 30 MIN
3.3750 g | Freq: Once | INTRAVENOUS | Status: AC
  Administered 2023-01-19: 3.375 g via INTRAVENOUS
  Filled 2023-01-19: qty 50

## 2023-01-19 MED ORDER — SALINE SPRAY 0.65 % NA SOLN: 1.0000 | Freq: Four times a day (QID) | NASAL | Status: DC | PRN

## 2023-01-19 MED ORDER — SODIUM CHLORIDE 0.9 % IV BOLUS
1000.0000 mL | Freq: Once | INTRAVENOUS | Status: AC
  Administered 2023-01-19: 1000 mL via INTRAVENOUS

## 2023-01-19 MED ORDER — LACTATED RINGERS IV BOLUS: 1000.0000 mL | Freq: Three times a day (TID) | INTRAVENOUS | Status: AC | PRN

## 2023-01-19 MED ORDER — HYDROMORPHONE HCL 1 MG/ML IJ SOLN
0.5000 mg | INTRAMUSCULAR | Status: DC | PRN
  Administered 2023-01-19 – 2023-01-20 (×8): 1 mg via INTRAVENOUS
  Filled 2023-01-19 (×8): qty 1

## 2023-01-19 MED ORDER — ALUM & MAG HYDROXIDE-SIMETH 200-200-20 MG/5ML PO SUSP: 30.0000 mL | Freq: Four times a day (QID) | ORAL | Status: DC | PRN

## 2023-01-19 MED ORDER — ONDANSETRON HCL 4 MG/2ML IJ SOLN
4.0000 mg | Freq: Four times a day (QID) | INTRAMUSCULAR | Status: DC | PRN
  Administered 2023-01-19: 4 mg via INTRAVENOUS
  Filled 2023-01-19: qty 2

## 2023-01-19 MED ORDER — PROCHLORPERAZINE EDISYLATE 10 MG/2ML IJ SOLN: 5.0000 mg | INTRAMUSCULAR | Status: DC | PRN

## 2023-01-19 MED ORDER — PIPERACILLIN-TAZOBACTAM 3.375 G IVPB
3.3750 g | Freq: Three times a day (TID) | INTRAVENOUS | Status: DC
  Administered 2023-01-19 – 2023-01-21 (×6): 3.375 g via INTRAVENOUS
  Filled 2023-01-19 (×6): qty 50

## 2023-01-19 MED ORDER — NAPHAZOLINE-GLYCERIN 0.012-0.25 % OP SOLN: 1.0000 [drp] | Freq: Four times a day (QID) | OPHTHALMIC | Status: DC | PRN

## 2023-01-19 MED ORDER — CALCIUM CARBONATE ANTACID 500 MG PO CHEW
1.0000 | CHEWABLE_TABLET | Freq: Three times a day (TID) | ORAL | Status: AC
  Administered 2023-01-19 – 2023-01-20 (×3): 200 mg via ORAL
  Filled 2023-01-19 (×3): qty 1

## 2023-01-19 MED ORDER — MAGIC MOUTHWASH: 15.0000 mL | Freq: Four times a day (QID) | ORAL | Status: DC | PRN

## 2023-01-19 MED ORDER — SODIUM CHLORIDE 0.9 % IV SOLN: 8.0000 mg | Freq: Four times a day (QID) | INTRAVENOUS | Status: DC | PRN

## 2023-01-19 MED ORDER — ENOXAPARIN SODIUM 40 MG/0.4ML IJ SOSY
40.0000 mg | PREFILLED_SYRINGE | INTRAMUSCULAR | Status: DC
  Administered 2023-01-21: 40 mg via SUBCUTANEOUS
  Filled 2023-01-19: qty 0.4

## 2023-01-19 MED ORDER — ACETAMINOPHEN 500 MG PO TABS
1000.0000 mg | ORAL_TABLET | Freq: Four times a day (QID) | ORAL | Status: DC | PRN
  Administered 2023-01-20: 1000 mg via ORAL
  Filled 2023-01-19: qty 2

## 2023-01-19 MED ORDER — LACTATED RINGERS IV BOLUS
1000.0000 mL | Freq: Once | INTRAVENOUS | Status: AC
  Administered 2023-01-19: 1000 mL via INTRAVENOUS

## 2023-01-19 MED ORDER — SIMETHICONE 40 MG/0.6ML PO SUSP: 80.0000 mg | Freq: Four times a day (QID) | ORAL | Status: DC | PRN

## 2023-01-19 MED ORDER — TRAMADOL HCL 50 MG PO TABS
50.0000 mg | ORAL_TABLET | Freq: Four times a day (QID) | ORAL | Status: DC | PRN
  Administered 2023-01-21: 50 mg via ORAL
  Filled 2023-01-19: qty 1

## 2023-01-19 MED ORDER — IOHEXOL 300 MG/ML  SOLN
100.0000 mL | Freq: Once | INTRAMUSCULAR | Status: AC | PRN
  Administered 2023-01-19: 100 mL via INTRAVENOUS

## 2023-01-19 MED ORDER — SODIUM CHLORIDE (PF) 0.9 % IJ SOLN
INTRAMUSCULAR | Status: AC
  Filled 2023-01-19: qty 50

## 2023-01-19 MED ORDER — LACTATED RINGERS IV SOLN: INTRAVENOUS | Status: DC

## 2023-01-19 MED ORDER — ONDANSETRON HCL 4 MG/2ML IJ SOLN
4.0000 mg | Freq: Once | INTRAMUSCULAR | Status: AC
  Administered 2023-01-19: 4 mg via INTRAVENOUS
  Filled 2023-01-19: qty 2

## 2023-01-19 MED ORDER — PHENOL 1.4 % MT LIQD: 2.0000 | OROMUCOSAL | Status: DC | PRN

## 2023-01-19 MED ORDER — OXYCODONE HCL 5 MG PO TABS: 5.0000 mg | ORAL_TABLET | ORAL | Status: DC | PRN

## 2023-01-19 NOTE — ED Notes (Signed)
Admit Provider at bedside. 

## 2023-01-19 NOTE — Progress Notes (Signed)
PROGRESS NOTE    Raudel Cunigan  LKG:[id number] DOB: [birth year] DOA: 01/18/2023 PCP: Patient, No Pcp Per   Brief Narrative: Haines Morales is a 42 y.o. male with a history of obesity.  Patient presented secondary to secondary to flank pain and was found to have evidence of acute diverticulitis with concern for microperforation on CT imaging.  Empiric antibiotics started.  General surgery consulted.   Assessment/Plan:  Acute diverticulitis with microperforation Patient presented with flank pain.  CT abdomen/pelvis on admission was significant for diverticulitis involving the mid descending colon with changes consistent with microperforation.  General surgery was consulted.  Empiric Zosyn started on admission.  Blood cultures obtained. -Continue Zosyn -Follow general surgery recommendations: NPO with ice chips, IVF, conservative management  Hypokalemia Potassium is low was 3.2 this admission -Potassium supplementation  Hypocalcemia Mild.  Calcium of 8.3 this morning. -Tums  Hyperbilirubinemia Mild.  Possibly secondary to acute illness.  No liver or biliary pathology seen on CT imaging.  Obesity Estimated body mass index is 31.96 kg/m as calculated from the following:   Height as of this encounter: 5\' 6"  (1.676 m).   Weight as of this encounter: 89.8 kg.   DVT prophylaxis: Lovenox Code Status:   Code Status: Full Code Family Communication: None at bedside Disposition Plan: Discharge home likely in 2-4 days pending ability to advance diet and further general surgery recommendations.   Consultants:  General surgery  Procedures:  None  Antimicrobials: Zosyn IV   Subjective: Patient reports continued left sided abdominal pain. No other concerns.  Objective: BP 121/77   Pulse 60   Temp 98.2 F (36.8 C) (Oral)   Resp 16   Ht 5\' 6"  (1.676 m)   Wt 89.8 kg   SpO2 95%   BMI 31.96 kg/m   Examination:  General exam: Appears calm and  comfortable Respiratory system: Clear to auscultation. Respiratory effort normal. Cardiovascular system: S1 & S2 heard, RRR. No murmurs, rubs, gallops or clicks. Gastrointestinal system: Abdomen is nondistended, soft and mildly tender in left quadrant. Normal bowel sounds heard. Central nervous system: Alert and oriented. No focal neurological deficits. Musculoskeletal: No edema. No calf tenderness Skin: No cyanosis. No rashes Psychiatry: Judgement and insight appear normal. Mood & affect appropriate.    Data Reviewed: I have personally reviewed following labs and imaging studies   Last CBC Lab Results  Component Value Date   WBC 13.5 (H) 01/18/2023   HGB 13.9 01/18/2023   HCT 41.6 01/18/2023   MCV 91.2 01/18/2023   MCH 30.5 01/18/2023   RDW 12.6 01/18/2023   PLT 266 01/18/2023     Last metabolic panel Lab Results  Component Value Date   GLUCOSE 108 (H) 01/19/2023   NA 133 (L) 01/19/2023   K 3.3 (L) 01/19/2023   CL 102 01/19/2023   CO2 22 01/19/2023   BUN 11 01/19/2023   CREATININE 0.77 01/19/2023   GFRNONAA >60 01/19/2023   CALCIUM 8.3 (L) 01/19/2023   PROT 7.7 01/18/2023   ALBUMIN 4.2 01/18/2023   BILITOT 1.8 (H) 01/18/2023   ALKPHOS 66 01/18/2023   AST 20 01/18/2023   ALT 22 01/18/2023   ANIONGAP 9 01/19/2023     Creatinine Clearance: Estimated Creatinine Clearance: 130.1 mL/min (by C-G formula based on SCr of 0.77 mg/dL).   Radiology Studies: CT ABDOMEN PELVIS W CONTRAST  Result Date: 01/19/2023 CLINICAL DATA:  Left-sided flank pain for 1 day with microhematuria, initial encounter EXAM: CT ABDOMEN AND PELVIS WITH CONTRAST TECHNIQUE: Multidetector CT imaging  of the abdomen and pelvis was performed using the standard protocol following bolus administration of intravenous contrast. RADIATION DOSE REDUCTION: This exam was performed according to the departmental dose-optimization program which includes automated exposure control, adjustment of the mA and/or kV  according to patient size and/or use of iterative reconstruction technique. CONTRAST:  OMNIPAQUE IOHEXOL 300 MG/ML  SOLN COMPARISON:  Plain film from the previous day. FINDINGS: Lower chest: No acute abnormality. Hepatobiliary: No focal liver abnormality is seen. No gallstones, gallbladder wall thickening, or biliary dilatation. Pancreas: Unremarkable. No pancreatic ductal dilatation or surrounding inflammatory changes. Spleen: Normal in size without focal abnormality. Adrenals/Urinary Tract: Adrenal glands are within normal limits. Kidneys show a normal enhancement pattern. No renal calculi or obstructive changes are noted. The bladder is decompressed. Stomach/Bowel: Diverticular change of the colon is noted without evidence of diverticulitis in the mid descending colon. Tiny extraluminal focus of air is noted likely related micro perforation. No abscess is seen. The more proximal colon is unremarkable. Appendix is unremarkable. Small bowel and stomach are within normal limits. Vascular/Lymphatic: No significant vascular findings are present. No enlarged abdominal or pelvic lymph nodes. Reproductive: Prostate is unremarkable. Other: No abdominal wall hernia or abnormality. No abdominopelvic ascites. Musculoskeletal: No acute or significant osseous findings. IMPRESSION: Diverticulitis involving the mid descending colon. Changes consistent with micro perforation are noted although no abscess is seen. No other focal abnormality is noted. Electronically Signed   By: Alcide Clever M.D.   On: 01/19/2023 02:57   DG Abdomen 1 View  Result Date: 01/18/2023 CLINICAL DATA:  Left lower quadrant pain.  Concern for stone. EXAM: ABDOMEN - 1 VIEW COMPARISON:  None Available. FINDINGS: No definite renal or ureteral stones are seen. There is a nonobstructive bowel gas pattern. There is no definite free intraperitoneal air in the imaged abdomen There is no acute osseous abnormality. IMPRESSION: Unremarkable KUB.  No definite  stones identified. Electronically Signed   By: Lesia Hausen M.D.   On: 01/18/2023 15:06      LOS: 0 days    Jacquelin Hawking, MD [facility] 01/19/2023, 8:25 AM   If 7PM-7AM, please contact night-coverage [email or web address]

## 2023-01-19 NOTE — Progress Notes (Signed)
A consult was received from an ED physician for Zosyn per pharmacy dosing.  The patient's profile has been reviewed for ht/wt/allergies/indication/available labs.   A one time order has been placed for Zosyn 3.375gm IV Q8h to be infused over 30 min.   Further antibiotics/pharmacy consults should be ordered by admitting physician if indicated.                       Thank you, Junita Push PharmD 01/19/2023  3:26 AM

## 2023-01-19 NOTE — Consult Note (Signed)
Tom Le December 09, 1980  [id number].    Requesting MD: Dr. Lyda Perone Chief Complaint/Reason for Consult: diverticulitis with microperforation  HPI:  This is a 42 yo white male with no significant past medical history who woke up around 0430 yesterday morning feeling the need to void.  He voided and then continued to have some abdominal pain in the left mid quadrant of his abdomen.  He had some mild nausea, but as still able to eat and tolerate a diet with no issues.  He did have one loose BM but no blood was present.  He denies every having had a colonoscopy or the need for one.  He admits to a fever, but only while here.  He has never had symptoms like this before.  His pain increased throughout the day and he presented to the [facility] for further evaluation.  He was noted to have descending colon diverticulitis with a tiny foci of extraluminal air c/w microperforation.  His WBC is around 13K.  He is being admitted by the hospitalist and we have been asked to see for further recommendations.  ROS: ROS: Please see HPI  History reviewed. No pertinent family history.  History reviewed. No pertinent past medical history.  Past Surgical History:  Procedure Laterality Date   INCISION AND DRAINAGE ABSCESS Left 01/19/2016   Procedure: INCISION AND DRAINAGE ABSCESS LEFT THIGH;  Surgeon: Harriette Bouillon, MD;  Location: [facility] OR;  Service: General;  Laterality: Left;    Social History:  reports that he has quit smoking. His smoking use included cigarettes. He has never used smokeless tobacco. He reports current alcohol use. He reports current drug use. Frequency: 2.00 times per week. Drug: Marijuana.  Allergies: No Known Allergies  (Not in a hospital admission)    Physical Exam: Blood pressure 121/77, pulse 60, temperature 98.2 F (36.8 C), temperature source Oral, resp. rate 16, height 5\' 6"  (1.676 m), weight 89.8 kg, SpO2 95%. General: pleasant, WD, WN white male who is laying in bed  in NAD HEENT: head is normocephalic, atraumatic.  Sclera are noninjected.  PERRL.  Ears and nose without any masses or lesions.  Mouth is pink and moist Heart: regular, rate, and rhythm.  Normal s1,s2. No obvious murmurs, gallops, or rubs noted.  Palpable radial and pedal pulses bilaterally Lungs: CTAB, no wheezes, rhonchi, or rales noted.  Respiratory effort nonlabored Abd: soft, tender in left mid quadrant but with no guarding or peritonitis, ND, +BS, no masses, hernias, or organomegaly Psych: A&Ox3 with an appropriate affect.   Results for orders placed or performed during the hospital encounter of 01/18/23 (from the past 48 hour(s))  Urinalysis, Routine w reflex microscopic -Urine, Clean Catch     Status: Abnormal   Collection Time: 01/18/23  8:58 PM  Result Value Ref Range   Color, Urine YELLOW YELLOW   APPearance CLEAR CLEAR   Specific Gravity, Urine 1.026 1.005 - 1.030   pH 5.0 5.0 - 8.0   Glucose, UA NEGATIVE NEGATIVE mg/dL   Hgb urine dipstick NEGATIVE NEGATIVE   Bilirubin Urine NEGATIVE NEGATIVE   Ketones, ur 5 (A) NEGATIVE mg/dL   Protein, ur NEGATIVE NEGATIVE mg/dL   Nitrite NEGATIVE NEGATIVE   Leukocytes,Ua NEGATIVE NEGATIVE    Comment: Performed at [facility], [address], [place], [place] [address]  CBC with Differential     Status: Abnormal   Collection Time: 01/18/23  8:58 PM  Result Value Ref Range   WBC 13.5 (H) 4.0 -  10.5 K/uL   RBC 4.56 4.22 - 5.81 MIL/uL   Hemoglobin 13.9 13.0 - 17.0 g/dL   HCT 44.0 10.2 - 72.5 %   MCV 91.2 80.0 - 100.0 fL   MCH 30.5 26.0 - 34.0 pg   MCHC 33.4 30.0 - 36.0 g/dL   RDW 36.6 44.0 - 34.7 %   Platelets 266 150 - 400 K/uL   nRBC 0.0 0.0 - 0.2 %   Neutrophils Relative % 82 %   Neutro Abs 11.0 (H) 1.7 - 7.7 K/uL   Lymphocytes Relative 9 %   Lymphs Abs 1.2 0.7 - 4.0 K/uL   Monocytes Relative 9 %   Monocytes Absolute 1.1 (H) 0.1 - 1.0 K/uL   Eosinophils Relative 0 %   Eosinophils Absolute 0.0 0.0 - 0.5  K/uL   Basophils Relative 0 %   Basophils Absolute 0.0 0.0 - 0.1 K/uL   Immature Granulocytes 0 %   Abs Immature Granulocytes 0.03 0.00 - 0.07 K/uL    Comment: Performed at [facility], [address], [place], [place] [address]  Comprehensive metabolic panel     Status: Abnormal   Collection Time: 01/18/23  8:58 PM  Result Value Ref Range   Sodium 135 135 - 145 mmol/L   Potassium 3.2 (L) 3.5 - 5.1 mmol/L   Chloride 104 98 - 111 mmol/L   CO2 21 (L) 22 - 32 mmol/L   Glucose, Bld 118 (H) 70 - 99 mg/dL    Comment: Glucose reference range applies only to samples taken after fasting for at least 8 hours.   BUN 13 6 - 20 mg/dL   Creatinine, Ser 6.38 0.61 - 1.24 mg/dL   Calcium 9.0 8.9 - 75.6 mg/dL   Total Protein 7.7 6.5 - 8.1 g/dL   Albumin 4.2 3.5 - 5.0 g/dL   AST 20 15 - 41 U/L   ALT 22 0 - 44 U/L   Alkaline Phosphatase 66 38 - 126 U/L   Total Bilirubin 1.8 (H) 0.3 - 1.2 mg/dL   GFR, Estimated >43 >32 mL/min    Comment: (NOTE) Calculated using the CKD-EPI Creatinine Equation (2021)    Anion gap 10 5 - 15    Comment: Performed at [facility], [address] [place], [place] [address]  Blood culture (routine x 2)     Status: None (Preliminary result)   Collection Time: 01/19/23  3:45 AM   Specimen: BLOOD  Result Value Ref Range   Specimen Description BLOOD SITE NOT SPECIFIED    Special Requests      BOTTLES DRAWN AEROBIC AND ANAEROBIC Blood Culture adequate volume Performed at [facility], [address], [place], [place] [address]    Culture PENDING    Report Status PENDING   I-Stat CG4 Lactic Acid     Status: Abnormal   Collection Time: 01/19/23  3:52 AM  Result Value Ref Range   Lactic Acid, Venous <0.3 (L) 0.5 - 1.9 mmol/L  Basic metabolic panel     Status: Abnormal   Collection Time: 01/19/23  4:50 AM  Result Value Ref Range   Sodium 133 (L) 135 - 145 mmol/L   Potassium 3.3 (L) 3.5 - 5.1 mmol/L   Chloride 102 98 - 111  mmol/L   CO2 22 22 - 32 mmol/L   Glucose, Bld 108 (H) 70 - 99 mg/dL    Comment: Glucose reference range applies only to samples taken after fasting for at least 8 hours.  BUN 11 6 - 20 mg/dL   Creatinine, Ser 3.47 0.61 - 1.24 mg/dL   Calcium 8.3 (L) 8.9 - 10.3 mg/dL   GFR, Estimated >42 >59 mL/min    Comment: (NOTE) Calculated using the CKD-EPI Creatinine Equation (2021)    Anion gap 9 5 - 15    Comment: Performed at [facility], [address] [place], [place] [address]   CT ABDOMEN PELVIS W CONTRAST  Result Date: 01/19/2023 CLINICAL DATA:  Left-sided flank pain for 1 day with microhematuria, initial encounter EXAM: CT ABDOMEN AND PELVIS WITH CONTRAST TECHNIQUE: Multidetector CT imaging of the abdomen and pelvis was performed using the standard protocol following bolus administration of intravenous contrast. RADIATION DOSE REDUCTION: This exam was performed according to the departmental dose-optimization program which includes automated exposure control, adjustment of the mA and/or kV according to patient size and/or use of iterative reconstruction technique. CONTRAST:  OMNIPAQUE IOHEXOL 300 MG/ML  SOLN COMPARISON:  Plain film from the previous day. FINDINGS: Lower chest: No acute abnormality. Hepatobiliary: No focal liver abnormality is seen. No gallstones, gallbladder wall thickening, or biliary dilatation. Pancreas: Unremarkable. No pancreatic ductal dilatation or surrounding inflammatory changes. Spleen: Normal in size without focal abnormality. Adrenals/Urinary Tract: Adrenal glands are within normal limits. Kidneys show a normal enhancement pattern. No renal calculi or obstructive changes are noted. The bladder is decompressed. Stomach/Bowel: Diverticular change of the colon is noted without evidence of diverticulitis in the mid descending colon. Tiny extraluminal focus of air is noted likely related micro perforation. No abscess is seen. The more proximal colon is  unremarkable. Appendix is unremarkable. Small bowel and stomach are within normal limits. Vascular/Lymphatic: No significant vascular findings are present. No enlarged abdominal or pelvic lymph nodes. Reproductive: Prostate is unremarkable. Other: No abdominal wall hernia or abnormality. No abdominopelvic ascites. Musculoskeletal: No acute or significant osseous findings. IMPRESSION: Diverticulitis involving the mid descending colon. Changes consistent with micro perforation are noted although no abscess is seen. No other focal abnormality is noted. Electronically Signed   By: Alcide Clever M.D.   On: 01/19/2023 02:57   DG Abdomen 1 View  Result Date: 01/18/2023 CLINICAL DATA:  Left lower quadrant pain.  Concern for stone. EXAM: ABDOMEN - 1 VIEW COMPARISON:  None Available. FINDINGS: No definite renal or ureteral stones are seen. There is a nonobstructive bowel gas pattern. There is no definite free intraperitoneal air in the imaged abdomen There is no acute osseous abnormality. IMPRESSION: Unremarkable KUB.  No definite stones identified. Electronically Signed   By: Lesia Hausen M.D.   On: 01/18/2023 15:06      Assessment/Plan Descending colonic diverticulitis with tiny microperf The patient has been seen, examined, labs, chart, vitals, and imaging have been personally reviewed.  He appears to have diverticulitis with a very small foci of extraluminal air.  He is hemodynamically stable and does not require operative intervention at this time.  We would recommend bowel rest today and IV abx therapy, which he has already been started on.  Given his exam and review of his imaging, I suspect he will likely quickly improve with conservative management.  We did discuss repeat imaging, drainage of abscess, or Hartmann's procedure if he were to fail medical management.  We also discussed the need for GI referral after his hospitalization to discuss a colonoscopy in 6-8 weeks.  He is agreeable to all of these  things.  We will continue to follow.   FEN - NPO, ice, sips with meds/IVFs  VTE - Lovenox ID - [email or web address]   I reviewed ED provider notes, hospitalist notes, last 24 h vitals and pain scores, last 48 h intake and output, last 24 h labs and trends, and last 24 h imaging results.  Letha Cape, [facility] 01/19/2023, 8:35 AM Please see Amion for pager number during day hours 7:00am-4:30pm or 7:00am -11:30am on weekends

## 2023-01-19 NOTE — ED Notes (Signed)
Spoke with dr. Julian Reil, pt is staying here at [facility] for admission, order updated to reflect the same

## 2023-01-19 NOTE — H&P (Signed)
History and Physical    Patient: Tom Le:[id number] DOB: Oct 09, 1980 DOA: 01/18/2023 DOS: the patient was seen and examined on 01/19/2023 PCP: Patient, No Pcp Per  Patient coming from: Home  Chief Complaint:  Chief Complaint  Patient presents with   Flank Pain   HPI: Tom Le is a 42 y.o. male with medical history significant of obesity, L thigh mass that ended up just being an abscess in 2017.  Otherwise healthy from medical standpoint.  Pt in to ED with c/o LLQ abd pain / flank pain.  Onset yesterday AM at 0400.  Onset was sudden.  Pain constant, worse with movement.  Associated with N/V.  Still passing gas but no BMs.  No urinary symptoms.  Has had subjective fevers and chills.  No cough, congestion, sick contacts, recent travel.  No PMH of abd surgeries.  Never had this in past.   Review of Systems: As mentioned in the history of present illness. All other systems reviewed and are negative. History reviewed. No pertinent past medical history. Past Surgical History:  Procedure Laterality Date   INCISION AND DRAINAGE ABSCESS Left 01/19/2016   Procedure: INCISION AND DRAINAGE ABSCESS LEFT THIGH;  Surgeon: Harriette Bouillon, MD;  Location: [facility] OR;  Service: General;  Laterality: Left;   Social History:  reports that he has quit smoking. His smoking use included cigarettes. He has never used smokeless tobacco. He reports current alcohol use. He reports current drug use. Frequency: 2.00 times per week. Drug: Marijuana.  No Known Allergies  History reviewed. No pertinent family history.  Prior to Admission medications   Medication Sig Start Date End Date Taking? Authorizing Provider  oxyCODONE-acetaminophen (PERCOCET/ROXICET) 5-325 MG tablet Take 1-2 tablets by mouth every 4 (four) hours as needed for moderate pain. 01/24/16   Adam Phenix, PA-C  tamsulosin (FLOMAX) 0.4 MG CAPS capsule Take 1 capsule (0.4 mg total) by mouth daily. 01/18/23   Coralyn Mark, NP  traMADol (ULTRAM) 50 MG tablet Take 1 tablet (50 mg total) by mouth every 6 (six) hours as needed for moderate pain. 01/24/16   Adam Phenix, PA-C    Physical Exam: Vitals:   01/19/23 0329 01/19/23 0400 01/19/23 0431 01/19/23 0530  BP:  109/67 106/66 121/77  Pulse:  70 67 60  Resp:  16 14 16   Temp: 98.7 F (37.1 C)     TempSrc: Oral     SpO2:  95% 97% 96%  Weight:      Height:       Constitutional: NAD, calm, comfortable Respiratory: clear to auscultation bilaterally, no wheezing, no crackles. Normal respiratory effort. No accessory muscle use.  Cardiovascular: Regular rate and rhythm, no murmurs / rubs / gallops. No extremity edema. 2+ pedal pulses. No carotid bruits.  Abdomen: LLQ TTP, no guarding, no rebound Neurologic: CN 2-12 grossly intact. Sensation intact, DTR normal. Strength 5/5 in all 4.  Psychiatric: Normal judgment and insight. Alert and oriented x 3. Normal mood.   Data Reviewed:    Labs on Admission: I have personally reviewed following labs and imaging studies  CBC: Recent Labs  Lab 01/18/23 2058  WBC 13.5*  NEUTROABS 11.0*  HGB 13.9  HCT 41.6  MCV 91.2  PLT 266   Basic Metabolic Panel: Recent Labs  Lab 01/18/23 2058 01/19/23 0450  NA 135 133*  K 3.2* 3.3*  CL 104 102  CO2 21* 22  GLUCOSE 118* 108*  BUN 13 11  CREATININE 0.86 0.77  CALCIUM 9.0 8.3*  GFR: Estimated Creatinine Clearance: 130.1 mL/min (by C-G formula based on SCr of 0.77 mg/dL). Liver Function Tests: Recent Labs  Lab 01/18/23 2058  AST 20  ALT 22  ALKPHOS 66  BILITOT 1.8*  PROT 7.7  ALBUMIN 4.2   No results for input(s): "LIPASE", "AMYLASE" in the last 168 hours. No results for input(s): "AMMONIA" in the last 168 hours. Coagulation Profile: No results for input(s): "INR", "PROTIME" in the last 168 hours. Cardiac Enzymes: No results for input(s): "CKTOTAL", "CKMB", "CKMBINDEX", "TROPONINI" in the last 168 hours. BNP (last 3 results) No results  for input(s): "PROBNP" in the last 8760 hours. HbA1C: No results for input(s): "HGBA1C" in the last 72 hours. CBG: No results for input(s): "GLUCAP" in the last 168 hours. Lipid Profile: No results for input(s): "CHOL", "HDL", "LDLCALC", "TRIG", "CHOLHDL", "LDLDIRECT" in the last 72 hours. Thyroid Function Tests: No results for input(s): "TSH", "T4TOTAL", "FREET4", "T3FREE", "THYROIDAB" in the last 72 hours. Anemia Panel: No results for input(s): "VITAMINB12", "FOLATE", "FERRITIN", "TIBC", "IRON", "RETICCTPCT" in the last 72 hours. Urine analysis:    Component Value Date/Time   COLORURINE YELLOW 01/18/2023 2058   APPEARANCEUR CLEAR 01/18/2023 2058   LABSPEC 1.026 01/18/2023 2058   PHURINE 5.0 01/18/2023 2058   GLUCOSEU NEGATIVE 01/18/2023 2058   HGBUR NEGATIVE 01/18/2023 2058   BILIRUBINUR NEGATIVE 01/18/2023 2058   BILIRUBINUR negative 01/18/2023 1251   KETONESUR 5 (A) 01/18/2023 2058   PROTEINUR NEGATIVE 01/18/2023 2058   UROBILINOGEN 0.2 01/18/2023 1251   NITRITE NEGATIVE 01/18/2023 2058   LEUKOCYTESUR NEGATIVE 01/18/2023 2058    Radiological Exams on Admission: CT ABDOMEN PELVIS W CONTRAST  Result Date: 01/19/2023 CLINICAL DATA:  Left-sided flank pain for 1 day with microhematuria, initial encounter EXAM: CT ABDOMEN AND PELVIS WITH CONTRAST TECHNIQUE: Multidetector CT imaging of the abdomen and pelvis was performed using the standard protocol following bolus administration of intravenous contrast. RADIATION DOSE REDUCTION: This exam was performed according to the departmental dose-optimization program which includes automated exposure control, adjustment of the mA and/or kV according to patient size and/or use of iterative reconstruction technique. CONTRAST:  OMNIPAQUE IOHEXOL 300 MG/ML  SOLN COMPARISON:  Plain film from the previous day. FINDINGS: Lower chest: No acute abnormality. Hepatobiliary: No focal liver abnormality is seen. No gallstones, gallbladder wall  thickening, or biliary dilatation. Pancreas: Unremarkable. No pancreatic ductal dilatation or surrounding inflammatory changes. Spleen: Normal in size without focal abnormality. Adrenals/Urinary Tract: Adrenal glands are within normal limits. Kidneys show a normal enhancement pattern. No renal calculi or obstructive changes are noted. The bladder is decompressed. Stomach/Bowel: Diverticular change of the colon is noted without evidence of diverticulitis in the mid descending colon. Tiny extraluminal focus of air is noted likely related micro perforation. No abscess is seen. The more proximal colon is unremarkable. Appendix is unremarkable. Small bowel and stomach are within normal limits. Vascular/Lymphatic: No significant vascular findings are present. No enlarged abdominal or pelvic lymph nodes. Reproductive: Prostate is unremarkable. Other: No abdominal wall hernia or abnormality. No abdominopelvic ascites. Musculoskeletal: No acute or significant osseous findings. IMPRESSION: Diverticulitis involving the mid descending colon. Changes consistent with micro perforation are noted although no abscess is seen. No other focal abnormality is noted. Electronically Signed   By: Alcide Clever M.D.   On: 01/19/2023 02:57   DG Abdomen 1 View  Result Date: 01/18/2023 CLINICAL DATA:  Left lower quadrant pain.  Concern for stone. EXAM: ABDOMEN - 1 VIEW COMPARISON:  None Available. FINDINGS: No definite renal or ureteral  stones are seen. There is a nonobstructive bowel gas pattern. There is no definite free intraperitoneal air in the imaged abdomen There is no acute osseous abnormality. IMPRESSION: Unremarkable KUB.  No definite stones identified. Electronically Signed   By: Lesia Hausen M.D.   On: 01/18/2023 15:06    EKG: Independently reviewed.   Assessment and Plan: * Diverticulitis of descending colon with perforation Diverticulitis with microperf NPO until we hear otherwise from general  surgery IVF Zosyn Dilaudid or oxy PRN pain See gen surg consult note      Advance Care Planning:   Code Status: Full Code  Consults: Dr. Michaell Cowing  Family Communication: No family in room  Severity of Illness: The appropriate patient status for this patient is INPATIENT. Inpatient status is judged to be reasonable and necessary in order to provide the required intensity of service to ensure the patient's safety. The patient's presenting symptoms, physical exam findings, and initial radiographic and laboratory data in the context of their chronic comorbidities is felt to place them at high risk for further clinical deterioration. Furthermore, it is not anticipated that the patient will be medically stable for discharge from the hospital within 2 midnights of admission.   * I certify that at the point of admission it is my clinical judgment that the patient will require inpatient hospital care spanning beyond 2 midnights from the point of admission due to high intensity of service, high risk for further deterioration and high frequency of surveillance required.*  Author: Hillary Bow., DO 01/19/2023 6:11 AM  For on call review [email or web address].

## 2023-01-19 NOTE — Assessment & Plan Note (Addendum)
Diverticulitis with microperf NPO until we hear otherwise from general surgery IVF Zosyn Dilaudid or oxy PRN pain See gen surg consult note

## 2023-01-19 NOTE — ED Provider Notes (Signed)
[facility] EMERGENCY DEPARTMENT AT [facility] Provider Note   CSN: [id number] Arrival date & time: 01/18/23  2000     History  Chief Complaint  Patient presents with   Flank Pain    Tom Le is a 42 y.o. male.  HPI   Patient without significant medical history presents with complaints of left lower quadrant/flank pain.  Patient states pain started yesterday morning at 4 AM, came on suddenly, states pain is constant, tends to worsen with movement, associated nausea vomiting, still passing gas but no bowel movements, denies any urinary symptoms, states she has had subjective fevers and chills any cough or congestion denies any recent sick contacts, no recent travels, denies any abnormal foods.  He denies any abdominal surgeries, is never had this in the past.  Home Medications Prior to Admission medications   Medication Sig Start Date End Date Taking? Authorizing Provider  oxyCODONE-acetaminophen (PERCOCET/ROXICET) 5-325 MG tablet Take 1-2 tablets by mouth every 4 (four) hours as needed for moderate pain. 01/24/16   Adam Phenix, PA-C  tamsulosin (FLOMAX) 0.4 MG CAPS capsule Take 1 capsule (0.4 mg total) by mouth daily. 01/18/23   Coralyn Mark, NP  traMADol (ULTRAM) 50 MG tablet Take 1 tablet (50 mg total) by mouth every 6 (six) hours as needed for moderate pain. 01/24/16   Adam Phenix, PA-C      Allergies    Patient has no known allergies.    Review of Systems   Review of Systems  Constitutional:  Positive for chills and fever.  Respiratory:  Negative for shortness of breath.   Cardiovascular:  Negative for chest pain.  Gastrointestinal:  Positive for abdominal pain and nausea. Negative for vomiting.  Neurological:  Negative for headaches.    Physical Exam Updated Vital Signs BP 121/77   Pulse 60   Temp 98.7 F (37.1 C) (Oral)   Resp 16   Ht 5\' 6"  (1.676 m)   Wt 89.8 kg   SpO2 96%   BMI 31.96 kg/m  Physical Exam Vitals and  nursing note reviewed.  Constitutional:      General: He is not in acute distress.    Appearance: He is not ill-appearing.  HENT:     Head: Normocephalic and atraumatic.     Nose: No congestion.  Eyes:     Conjunctiva/sclera: Conjunctivae normal.  Cardiovascular:     Rate and Rhythm: Regular rhythm. Tachycardia present.     Pulses: Normal pulses.     Heart sounds: No murmur heard.    No friction rub. No gallop.  Pulmonary:     Effort: No respiratory distress.     Breath sounds: No wheezing, rhonchi or rales.  Abdominal:     Palpations: Abdomen is soft.     Tenderness: There is abdominal tenderness. There is no right CVA tenderness or left CVA tenderness.     Comments: Abdomen nondistended, soft, notable tenderness in left lower quadrant/left flank, without guarding rebound tenderness or peritoneal sign.  Skin:    General: Skin is warm and dry.  Neurological:     Mental Status: He is alert.  Psychiatric:        Mood and Affect: Mood normal.     ED Results / Procedures / Treatments   Labs (all labs ordered are listed, but only abnormal results are displayed) Labs Reviewed  URINALYSIS, ROUTINE W REFLEX MICROSCOPIC - Abnormal; Notable for the following components:      Result Value   Ketones,  ur 5 (*)    All other components within normal limits  CBC WITH DIFFERENTIAL/PLATELET - Abnormal; Notable for the following components:   WBC 13.5 (*)    Neutro Abs 11.0 (*)    Monocytes Absolute 1.1 (*)    All other components within normal limits  COMPREHENSIVE METABOLIC PANEL - Abnormal; Notable for the following components:   Potassium 3.2 (*)    CO2 21 (*)    Glucose, Bld 118 (*)    Total Bilirubin 1.8 (*)    All other components within normal limits  BASIC METABOLIC PANEL - Abnormal; Notable for the following components:   Sodium 133 (*)    Potassium 3.3 (*)    Glucose, Bld 108 (*)    Calcium 8.3 (*)    All other components within normal limits  I-STAT CG4 LACTIC ACID, ED  - Abnormal; Notable for the following components:   Lactic Acid, Venous <0.3 (*)    All other components within normal limits  CULTURE, BLOOD (ROUTINE X 2)  CULTURE, BLOOD (ROUTINE X 2)  HIV ANTIBODY (ROUTINE TESTING W REFLEX)    EKG None  Radiology CT ABDOMEN PELVIS W CONTRAST  Result Date: 01/19/2023 CLINICAL DATA:  Left-sided flank pain for 1 day with microhematuria, initial encounter EXAM: CT ABDOMEN AND PELVIS WITH CONTRAST TECHNIQUE: Multidetector CT imaging of the abdomen and pelvis was performed using the standard protocol following bolus administration of intravenous contrast. RADIATION DOSE REDUCTION: This exam was performed according to the departmental dose-optimization program which includes automated exposure control, adjustment of the mA and/or kV according to patient size and/or use of iterative reconstruction technique. CONTRAST:  OMNIPAQUE IOHEXOL 300 MG/ML  SOLN COMPARISON:  Plain film from the previous day. FINDINGS: Lower chest: No acute abnormality. Hepatobiliary: No focal liver abnormality is seen. No gallstones, gallbladder wall thickening, or biliary dilatation. Pancreas: Unremarkable. No pancreatic ductal dilatation or surrounding inflammatory changes. Spleen: Normal in size without focal abnormality. Adrenals/Urinary Tract: Adrenal glands are within normal limits. Kidneys show a normal enhancement pattern. No renal calculi or obstructive changes are noted. The bladder is decompressed. Stomach/Bowel: Diverticular change of the colon is noted without evidence of diverticulitis in the mid descending colon. Tiny extraluminal focus of air is noted likely related micro perforation. No abscess is seen. The more proximal colon is unremarkable. Appendix is unremarkable. Small bowel and stomach are within normal limits. Vascular/Lymphatic: No significant vascular findings are present. No enlarged abdominal or pelvic lymph nodes. Reproductive: Prostate is unremarkable. Other: No  abdominal wall hernia or abnormality. No abdominopelvic ascites. Musculoskeletal: No acute or significant osseous findings. IMPRESSION: Diverticulitis involving the mid descending colon. Changes consistent with micro perforation are noted although no abscess is seen. No other focal abnormality is noted. Electronically Signed   By: Alcide Clever M.D.   On: 01/19/2023 02:57   DG Abdomen 1 View  Result Date: 01/18/2023 CLINICAL DATA:  Left lower quadrant pain.  Concern for stone. EXAM: ABDOMEN - 1 VIEW COMPARISON:  None Available. FINDINGS: No definite renal or ureteral stones are seen. There is a nonobstructive bowel gas pattern. There is no definite free intraperitoneal air in the imaged abdomen There is no acute osseous abnormality. IMPRESSION: Unremarkable KUB.  No definite stones identified. Electronically Signed   By: Lesia Hausen M.D.   On: 01/18/2023 15:06    Procedures Procedures    Medications Ordered in ED Medications  piperacillin-tazobactam (ZOSYN) IVPB 3.375 g (has no administration in time range)  lactated ringers bolus 1,000  mL (has no administration in time range)  HYDROmorphone (DILAUDID) injection 0.5-2 mg (has no administration in time range)  oxyCODONE (Oxy IR/ROXICODONE) immediate release tablet 5-10 mg (has no administration in time range)  traMADol (ULTRAM) tablet 50-100 mg (has no administration in time range)  ondansetron (ZOFRAN) injection 4 mg (has no administration in time range)    Or  ondansetron (ZOFRAN) 8 mg in sodium chloride 0.9 % 50 mL IVPB (has no administration in time range)  prochlorperazine (COMPAZINE) injection 5-10 mg (has no administration in time range)  phenol (CHLORASEPTIC) mouth spray 2 spray (has no administration in time range)  menthol-cetylpyridinium (CEPACOL) lozenge 3 mg (has no administration in time range)  magic mouthwash (has no administration in time range)  alum & mag hydroxide-simeth (MAALOX/MYLANTA) 200-200-20 MG/5ML suspension 30 mL  (has no administration in time range)  simethicone (MYLICON) 40 MG/0.6ML suspension 80 mg (has no administration in time range)  naphazoline-glycerin (CLEAR EYES REDNESS) ophth solution 1-2 drop (has no administration in time range)  sodium chloride (OCEAN) 0.65 % nasal spray 1-2 spray (has no administration in time range)  enoxaparin (LOVENOX) injection 40 mg (has no administration in time range)  lactated ringers infusion (has no administration in time range)  acetaminophen (TYLENOL) tablet 650 mg (650 mg Oral Given 01/18/23 2100)  sodium chloride 0.9 % bolus 1,000 mL (0 mLs Intravenous Stopped 01/19/23 0358)  HYDROmorphone (DILAUDID) injection 1 mg (1 mg Intravenous Given 01/19/23 0224)  ondansetron (ZOFRAN) injection 4 mg (4 mg Intravenous Given 01/19/23 0224)  iohexol (OMNIPAQUE) 300 MG/ML solution 100 mL (100 mLs Intravenous Contrast Given 01/19/23 0240)  piperacillin-tazobactam (ZOSYN) IVPB 3.375 g (0 g Intravenous Stopped 01/19/23 0447)  HYDROmorphone (DILAUDID) injection 1 mg (1 mg Intravenous Given 01/19/23 0408)  lactated ringers bolus 1,000 mL (0 mLs Intravenous Stopped 01/19/23 0554)    ED Course/ Medical Decision Making/ A&P                                 Medical Decision Making Amount and/or Complexity of Data Reviewed Labs: ordered. Radiology: ordered.  Risk OTC drugs. Prescription drug management. Decision regarding hospitalization.   This patient presents to the ED for concern of abdominal pain, this involves an extensive number of treatment options, and is a complaint that carries with it a high risk of complications and morbidity.  The differential diagnosis includes diverticulitis, kidney stone, pyelo-, bowel obstruction, volvulus,    Additional history obtained:  Additional history obtained from partner at bedside External records from outside source obtained and reviewed including urgent care notes   Co morbidities that complicate the patient  evaluation  N/A  Social Determinants of Health:  No primary care provider    Lab Tests:  I Ordered, and personally interpreted labs.  The pertinent results include: CBC shows leukocytosis of 13.5, CMP reveals potassium 3.2, CO2 21, glucose 118, UA is unremarkable, lactic unremarkable   Imaging Studies ordered:  I ordered imaging studies including CT abdomen pelvis I independently visualized and interpreted imaging which showed shows diverticulitis with microperforation I agree with the radiologist interpretation   Cardiac Monitoring:  The patient was maintained on a cardiac monitor.  I personally viewed and interpreted the cardiac monitored which showed an underlying rhythm of: N/A   Medicines ordered and prescription drug management:  I ordered medication including fluids, pain medication I have reviewed the patients home medicines and have made adjustments as needed  Critical  Interventions:  N/A   Reevaluation:  Presents with abdominal pain, lab work was obtained, shows leukocytosis, patient has noted left lower quadrant tenderness, will send down for CT abdomen pelvis for further assessment  CT scan reveals microperforation with active diverticulitis, no abscess, will start patient on antibiotics, consult with general surgery for further recommendations  Updated patient on recommendations he is in agreement with admission.  Consultations Obtained:  I requested consultation with the spoke with Dr. Michaell Cowing,  and discussed lab and imaging findings as well as pertinent plan - they recommend: Hospital admission, start antibiotics, will be billed for consultation Spoke with  Julian Reil will admit the patient    Test Considered:  N/a    Rule out Suspicion for UTI [place] or kidney stone is low at this time denies any urinary symptoms, UA is negative for signs infection or hematuria. low suspicion for lower lobe pneumonia as lung sounds are clear bilaterally, will defer  imaging at this time.  I have low suspicion for liver or gallbladder abnormality as she has no right upper quadrant tenderness, liver enzymes, alk phos, T bili all within normal limits.  Low suspicion for pancreatitis as lipase is within normal limits.  Suspicion for bowel obstruction, volvulus, intra-abdominal mass, appendicitis, but this time CT imaging negative signs.  Doubt AAA or dissection presentation atypical etiology.    Dispostion and problem list  After consideration of the diagnostic results and the patients response to treatment, I feel that the patent would benefit from admission.  Diverticulitis-started on antibiotics, patiently continue monitoring, formal consultation by general surgery.            Final Clinical Impression(s) / ED Diagnoses Final diagnoses:  Diverticulitis    Rx / DC Orders ED Discharge Orders     None         Carroll Sage, PA-C 01/19/23 4098    Nicanor Alcon, April, MD 01/19/23 1191

## 2023-01-19 NOTE — ED Notes (Signed)
ED TO INPATIENT HANDOFF REPORT  ED Nurse Name and Phone #: Kasheena Sambrano [phone]  S Name/Age/Gender Tom Le 42 y.o. male Room/Bed: WA18/WA18  Code Status   Code Status: Full Code  Home/SNF/Other Home Patient oriented to: self, place, time, and situation Is this baseline? Yes   Triage Complete: Triage complete  Chief Complaint Diverticulitis of colon with perforation [K57.20]  Triage Note Left flank pain beginning this morning with chills and suspected fevers.  Went to UC and had UA/ XR completed.   Given Rx for Tamsulosin but has not picked it up yet.      Allergies No Known Allergies  Level of Care/Admitting Diagnosis ED Disposition     ED Disposition  Admit   Condition  --   Comment  Hospital Area: [facility] [[id number]]  Level of Care: Med-Surg [16]  May admit patient to [facility] if equivalent level of care is available:: No  Covid Evaluation: Asymptomatic - no recent exposure (last 10 days) testing not required  Diagnosis: Diverticulitis of colon with perforation [161096]  Admitting Physician: Hillary Bow [[id number]]  Attending Physician: Hillary Bow [phone]  Certification:: I certify this patient will need inpatient services for at least 2 midnights  Expected Medical Readiness: 01/22/2023          B Medical/Surgery History History reviewed. No pertinent past medical history. Past Surgical History:  Procedure Laterality Date   INCISION AND DRAINAGE ABSCESS Left 01/19/2016   Procedure: INCISION AND DRAINAGE ABSCESS LEFT THIGH;  Surgeon: Harriette Bouillon, MD;  Location: [facility] OR;  Service: General;  Laterality: Left;     A IV Location/Drains/Wounds Patient Lines/Drains/Airways Status     Active Line/Drains/Airways     Name Placement date Placement time Site Days   Peripheral IV 01/19/23 20 G Left Antecubital 01/19/23  0223  Antecubital  less than 1            Intake/Output Last 24 hours No intake or  output data in the 24 hours ending 01/19/23 0559  Labs/Imaging Results for orders placed or performed during the hospital encounter of 01/18/23 (from the past 48 hour(s))  Urinalysis, Routine w reflex microscopic -Urine, Clean Catch     Status: Abnormal   Collection Time: 01/18/23  8:58 PM  Result Value Ref Range   Color, Urine YELLOW YELLOW   APPearance CLEAR CLEAR   Specific Gravity, Urine 1.026 1.005 - 1.030   pH 5.0 5.0 - 8.0   Glucose, UA NEGATIVE NEGATIVE mg/dL   Hgb urine dipstick NEGATIVE NEGATIVE   Bilirubin Urine NEGATIVE NEGATIVE   Ketones, ur 5 (A) NEGATIVE mg/dL   Protein, ur NEGATIVE NEGATIVE mg/dL   Nitrite NEGATIVE NEGATIVE   Leukocytes,Ua NEGATIVE NEGATIVE    Comment: Performed at [facility], [address] [place], [place] [address]  CBC with Differential     Status: Abnormal   Collection Time: 01/18/23  8:58 PM  Result Value Ref Range   WBC 13.5 (H) 4.0 - 10.5 K/uL   RBC 4.56 4.22 - 5.81 MIL/uL   Hemoglobin 13.9 13.0 - 17.0 g/dL   HCT 82.9 56.2 - 13.0 %   MCV 91.2 80.0 - 100.0 fL   MCH 30.5 26.0 - 34.0 pg   MCHC 33.4 30.0 - 36.0 g/dL   RDW 86.5 78.4 - 69.6 %   Platelets 266 150 - 400 K/uL   nRBC 0.0 0.0 - 0.2 %   Neutrophils Relative % 82 %  Neutro Abs 11.0 (H) 1.7 - 7.7 K/uL   Lymphocytes Relative 9 %   Lymphs Abs 1.2 0.7 - 4.0 K/uL   Monocytes Relative 9 %   Monocytes Absolute 1.1 (H) 0.1 - 1.0 K/uL   Eosinophils Relative 0 %   Eosinophils Absolute 0.0 0.0 - 0.5 K/uL   Basophils Relative 0 %   Basophils Absolute 0.0 0.0 - 0.1 K/uL   Immature Granulocytes 0 %   Abs Immature Granulocytes 0.03 0.00 - 0.07 K/uL    Comment: Performed at [facility], [address], [place], [place] [address]  Comprehensive metabolic panel     Status: Abnormal   Collection Time: 01/18/23  8:58 PM  Result Value Ref Range   Sodium 135 135 - 145 mmol/L   Potassium 3.2 (L) 3.5 - 5.1 mmol/L   Chloride 104 98 - 111 mmol/L   CO2 21  (L) 22 - 32 mmol/L   Glucose, Bld 118 (H) 70 - 99 mg/dL    Comment: Glucose reference range applies only to samples taken after fasting for at least 8 hours.   BUN 13 6 - 20 mg/dL   Creatinine, Ser 6.04 0.61 - 1.24 mg/dL   Calcium 9.0 8.9 - 54.0 mg/dL   Total Protein 7.7 6.5 - 8.1 g/dL   Albumin 4.2 3.5 - 5.0 g/dL   AST 20 15 - 41 U/L   ALT 22 0 - 44 U/L   Alkaline Phosphatase 66 38 - 126 U/L   Total Bilirubin 1.8 (H) 0.3 - 1.2 mg/dL   GFR, Estimated >98 >11 mL/min    Comment: (NOTE) Calculated using the CKD-EPI Creatinine Equation (2021)    Anion gap 10 5 - 15    Comment: Performed at [facility], [address] [place], [place] [address]  I-Stat CG4 Lactic Acid     Status: Abnormal   Collection Time: 01/19/23  3:52 AM  Result Value Ref Range   Lactic Acid, Venous <0.3 (L) 0.5 - 1.9 mmol/L  Basic metabolic panel     Status: Abnormal   Collection Time: 01/19/23  4:50 AM  Result Value Ref Range   Sodium 133 (L) 135 - 145 mmol/L   Potassium 3.3 (L) 3.5 - 5.1 mmol/L   Chloride 102 98 - 111 mmol/L   CO2 22 22 - 32 mmol/L   Glucose, Bld 108 (H) 70 - 99 mg/dL    Comment: Glucose reference range applies only to samples taken after fasting for at least 8 hours.   BUN 11 6 - 20 mg/dL   Creatinine, Ser 2.95 0.61 - 1.24 mg/dL   Calcium 8.3 (L) 8.9 - 10.3 mg/dL   GFR, Estimated >62 >13 mL/min    Comment: (NOTE) Calculated using the CKD-EPI Creatinine Equation (2021)    Anion gap 9 5 - 15    Comment: Performed at [facility], [address], [place], [place] [address]   CT ABDOMEN PELVIS W CONTRAST  Result Date: 01/19/2023 CLINICAL DATA:  Left-sided flank pain for 1 day with microhematuria, initial encounter EXAM: CT ABDOMEN AND PELVIS WITH CONTRAST TECHNIQUE: Multidetector CT imaging of the abdomen and pelvis was performed using the standard protocol following bolus administration of intravenous contrast. RADIATION DOSE REDUCTION: This exam was  performed according to the departmental dose-optimization program which includes automated exposure control, adjustment of the mA and/or kV according to patient size and/or use of iterative reconstruction technique. CONTRAST:  OMNIPAQUE IOHEXOL 300 MG/ML  SOLN COMPARISON:  Plain film from the previous day. FINDINGS: Lower chest: No acute abnormality. Hepatobiliary: No focal liver abnormality is seen. No gallstones, gallbladder wall thickening, or biliary dilatation. Pancreas: Unremarkable. No pancreatic ductal dilatation or surrounding inflammatory changes. Spleen: Normal in size without focal abnormality. Adrenals/Urinary Tract: Adrenal glands are within normal limits. Kidneys show a normal enhancement pattern. No renal calculi or obstructive changes are noted. The bladder is decompressed. Stomach/Bowel: Diverticular change of the colon is noted without evidence of diverticulitis in the mid descending colon. Tiny extraluminal focus of air is noted likely related micro perforation. No abscess is seen. The more proximal colon is unremarkable. Appendix is unremarkable. Small bowel and stomach are within normal limits. Vascular/Lymphatic: No significant vascular findings are present. No enlarged abdominal or pelvic lymph nodes. Reproductive: Prostate is unremarkable. Other: No abdominal wall hernia or abnormality. No abdominopelvic ascites. Musculoskeletal: No acute or significant osseous findings. IMPRESSION: Diverticulitis involving the mid descending colon. Changes consistent with micro perforation are noted although no abscess is seen. No other focal abnormality is noted. Electronically Signed   By: Alcide Clever M.D.   On: 01/19/2023 02:57   DG Abdomen 1 View  Result Date: 01/18/2023 CLINICAL DATA:  Left lower quadrant pain.  Concern for stone. EXAM: ABDOMEN - 1 VIEW COMPARISON:  None Available. FINDINGS: No definite renal or ureteral stones are seen. There is a nonobstructive bowel gas pattern. There is  no definite free intraperitoneal air in the imaged abdomen There is no acute osseous abnormality. IMPRESSION: Unremarkable KUB.  No definite stones identified. Electronically Signed   By: Lesia Hausen M.D.   On: 01/18/2023 15:06    Pending Labs Unresulted Labs (From admission, onward)     Start     Ordered   01/20/23 0500  CBC  Daily,   R      01/19/23 0417   01/20/23 0500  Basic metabolic panel  Tomorrow morning,   R        01/19/23 0545   01/19/23 0548  HIV Antibody (routine testing w rflx)  (HIV Antibody (Routine testing w reflex) panel)  Once,   R        01/19/23 0552   01/19/23 0320  Blood culture (routine x 2)  BLOOD CULTURE X 2,   R (with STAT occurrences)      01/19/23 0320            Vitals/Pain Today's Vitals   01/19/23 0400 01/19/23 0431 01/19/23 0457 01/19/23 0530  BP: 109/67 106/66  121/77  Pulse: 70 67  60  Resp: 16 14  16   Temp:      TempSrc:      SpO2: 95% 97%  96%  Weight:      Height:      PainSc:   5      Isolation Precautions No active isolations  Medications Medications  piperacillin-tazobactam (ZOSYN) IVPB 3.375 g (has no administration in time range)  lactated ringers bolus 1,000 mL (has no administration in time range)  HYDROmorphone (DILAUDID) injection 0.5-2 mg (has no administration in time range)  oxyCODONE (Oxy IR/ROXICODONE) immediate release tablet 5-10 mg (has no administration in time range)  traMADol (ULTRAM) tablet 50-100 mg (has no administration in time range)  ondansetron (ZOFRAN) injection 4 mg (has no administration in time range)    Or  ondansetron (ZOFRAN) 8 mg in sodium chloride 0.9 % 50 mL IVPB (has no administration in time range)  prochlorperazine (COMPAZINE) injection 5-10 mg (has no administration in time  range)  phenol (CHLORASEPTIC) mouth spray 2 spray (has no administration in time range)  menthol-cetylpyridinium (CEPACOL) lozenge 3 mg (has no administration in time range)  magic mouthwash (has no administration in  time range)  alum & mag hydroxide-simeth (MAALOX/MYLANTA) 200-200-20 MG/5ML suspension 30 mL (has no administration in time range)  simethicone (MYLICON) 40 MG/0.6ML suspension 80 mg (has no administration in time range)  naphazoline-glycerin (CLEAR EYES REDNESS) ophth solution 1-2 drop (has no administration in time range)  sodium chloride (OCEAN) 0.65 % nasal spray 1-2 spray (has no administration in time range)  enoxaparin (LOVENOX) injection 40 mg (has no administration in time range)  lactated ringers infusion (has no administration in time range)  acetaminophen (TYLENOL) tablet 650 mg (650 mg Oral Given 01/18/23 2100)  sodium chloride 0.9 % bolus 1,000 mL (0 mLs Intravenous Stopped 01/19/23 0358)  HYDROmorphone (DILAUDID) injection 1 mg (1 mg Intravenous Given 01/19/23 0224)  ondansetron (ZOFRAN) injection 4 mg (4 mg Intravenous Given 01/19/23 0224)  iohexol (OMNIPAQUE) 300 MG/ML solution 100 mL (100 mLs Intravenous Contrast Given 01/19/23 0240)  piperacillin-tazobactam (ZOSYN) IVPB 3.375 g (0 g Intravenous Stopped 01/19/23 0447)  HYDROmorphone (DILAUDID) injection 1 mg (1 mg Intravenous Given 01/19/23 0408)  lactated ringers bolus 1,000 mL (0 mLs Intravenous Stopped 01/19/23 0554)    Mobility walks      R Recommendations: See Admitting Provider Note  Report given to:   Additional Notes:

## 2023-01-19 NOTE — ED Notes (Signed)
ED TO INPATIENT HANDOFF REPORT  ED Nurse Name and Phone #: Crist Infante, RN [phone]  S Name/Age/Gender Tom Le 42 y.o. male Room/Bed: WA18/WA18  Code Status   Code Status: Full Code  Home/SNF/Other Home Patient oriented to: self, place, time, and situation Is this baseline? Yes   Triage Complete: Triage complete  Chief Complaint Diverticulitis of colon with perforation [K57.20]  Triage Note Left flank pain beginning this morning with chills and suspected fevers.  Went to UC and had UA/ XR completed.   Given Rx for Tamsulosin but has not picked it up yet.      Allergies No Known Allergies  Level of Care/Admitting Diagnosis ED Disposition     ED Disposition  Admit   Condition  --   Comment  H[facility] [[id number]]  Level of Care: Med-Surg [16]  [facility] if equivalent level of care is available:: No  Covid Evaluation: Asymptomatic - no recent exposure (last 10 days) testing not required  Diagnosis: Diverticulitis of colon with perforation [829562]  Admitting Physician: Hillary Bow [[id number]]  Attending Physician: Hillary Bow [phone]  Certification:: I certify this patient will need inpatient services for at least 2 midnights  Expected Medical Readiness: 01/22/2023          B Medical/Surgery History History reviewed. No pertinent past medical history. Past Surgical History:  Procedure Laterality Date   INCISION AND DRAINAGE ABSCESS Left 01/19/2016   Procedure: INCISION AND DRAINAGE ABSCESS LEFT THIGH;  Surgeon: Harriette Bouillon, MD;  Location: [facility] OR;  Service: General;  Laterality: Left;     A IV Location/Drains/Wounds Patient Lines/Drains/Airways Status     Active Line/Drains/Airways     Name Placement date Placement time Site Days   Peripheral IV 01/19/23 20 G Left Antecubital 01/19/23  0223  Antecubital  less than 1            Intake/Output Last 24 hours No intake  or output data in the 24 hours ending 01/19/23 0735  Labs/Imaging Results for orders placed or performed during the hospital encounter of 01/18/23 (from the past 48 hour(s))  Urinalysis, Routine w reflex microscopic -Urine, Clean Catch     Status: Abnormal   Collection Time: 01/18/23  8:58 PM  Result Value Ref Range   Color, Urine YELLOW YELLOW   APPearance CLEAR CLEAR   Specific Gravity, Urine 1.026 1.005 - 1.030   pH 5.0 5.0 - 8.0   Glucose, UA NEGATIVE NEGATIVE mg/dL   Hgb urine dipstick NEGATIVE NEGATIVE   Bilirubin Urine NEGATIVE NEGATIVE   Ketones, ur 5 (A) NEGATIVE mg/dL   Protein, ur NEGATIVE NEGATIVE mg/dL   Nitrite NEGATIVE NEGATIVE   Leukocytes,Ua NEGATIVE NEGATIVE    Comment: Performed at [facility], [address] [place], [place] [address]  CBC with Differential     Status: Abnormal   Collection Time: 01/18/23  8:58 PM  Result Value Ref Range   WBC 13.5 (H) 4.0 - 10.5 K/uL   RBC 4.56 4.22 - 5.81 MIL/uL   Hemoglobin 13.9 13.0 - 17.0 g/dL   HCT 95.2 84.1 - 32.4 %   MCV 91.2 80.0 - 100.0 fL   MCH 30.5 26.0 - 34.0 pg   MCHC 33.4 30.0 - 36.0 g/dL   RDW 40.1 02.7 - 25.3 %   Platelets 266 150 - 400 K/uL   nRBC 0.0 0.0 - 0.2 %   Neutrophils Relative % 82 %  Neutro Abs 11.0 (H) 1.7 - 7.7 K/uL   Lymphocytes Relative 9 %   Lymphs Abs 1.2 0.7 - 4.0 K/uL   Monocytes Relative 9 %   Monocytes Absolute 1.1 (H) 0.1 - 1.0 K/uL   Eosinophils Relative 0 %   Eosinophils Absolute 0.0 0.0 - 0.5 K/uL   Basophils Relative 0 %   Basophils Absolute 0.0 0.0 - 0.1 K/uL   Immature Granulocytes 0 %   Abs Immature Granulocytes 0.03 0.00 - 0.07 K/uL    Comment: Performed at [facility], [address], [place], [place] [address]  Comprehensive metabolic panel     Status: Abnormal   Collection Time: 01/18/23  8:58 PM  Result Value Ref Range   Sodium 135 135 - 145 mmol/L   Potassium 3.2 (L) 3.5 - 5.1 mmol/L   Chloride 104 98 - 111 mmol/L   CO2  21 (L) 22 - 32 mmol/L   Glucose, Bld 118 (H) 70 - 99 mg/dL    Comment: Glucose reference range applies only to samples taken after fasting for at least 8 hours.   BUN 13 6 - 20 mg/dL   Creatinine, Ser 6.29 0.61 - 1.24 mg/dL   Calcium 9.0 8.9 - 52.8 mg/dL   Total Protein 7.7 6.5 - 8.1 g/dL   Albumin 4.2 3.5 - 5.0 g/dL   AST 20 15 - 41 U/L   ALT 22 0 - 44 U/L   Alkaline Phosphatase 66 38 - 126 U/L   Total Bilirubin 1.8 (H) 0.3 - 1.2 mg/dL   GFR, Estimated >41 >32 mL/min    Comment: (NOTE) Calculated using the CKD-EPI Creatinine Equation (2021)    Anion gap 10 5 - 15    Comment: Performed at [facility], [address] [place], [place] [address]  Blood culture (routine x 2)     Status: None (Preliminary result)   Collection Time: 01/19/23  3:45 AM   Specimen: BLOOD  Result Value Ref Range   Specimen Description BLOOD SITE NOT SPECIFIED    Special Requests      BOTTLES DRAWN AEROBIC AND ANAEROBIC Blood Culture adequate volume Performed at [facility], [address], [place], [place] [address]    Culture PENDING    Report Status PENDING   I-Stat CG4 Lactic Acid     Status: Abnormal   Collection Time: 01/19/23  3:52 AM  Result Value Ref Range   Lactic Acid, Venous <0.3 (L) 0.5 - 1.9 mmol/L  Basic metabolic panel     Status: Abnormal   Collection Time: 01/19/23  4:50 AM  Result Value Ref Range   Sodium 133 (L) 135 - 145 mmol/L   Potassium 3.3 (L) 3.5 - 5.1 mmol/L   Chloride 102 98 - 111 mmol/L   CO2 22 22 - 32 mmol/L   Glucose, Bld 108 (H) 70 - 99 mg/dL    Comment: Glucose reference range applies only to samples taken after fasting for at least 8 hours.   BUN 11 6 - 20 mg/dL   Creatinine, Ser 6.64 0.61 - 1.24 mg/dL   Calcium 8.3 (L) 8.9 - 10.3 mg/dL   GFR, Estimated >40 >34 mL/min    Comment: (NOTE) Calculated using the CKD-EPI Creatinine Equation (2021)    Anion gap 9 5 - 15    Comment: Performed at [facility], [address], [place], [place] [address]   CT ABDOMEN PELVIS W CONTRAST  Result Date: 01/19/2023 CLINICAL DATA:  Left-sided flank pain for 1 day with microhematuria, initial encounter EXAM: CT ABDOMEN AND PELVIS WITH CONTRAST TECHNIQUE: Multidetector CT imaging of the abdomen and pelvis was performed using the standard protocol following bolus administration of intravenous contrast. RADIATION DOSE REDUCTION: This exam was performed according to the departmental dose-optimization program which includes automated exposure control, adjustment of the mA and/or kV according to patient size and/or use of iterative reconstruction technique. CONTRAST:  OMNIPAQUE IOHEXOL 300 MG/ML  SOLN COMPARISON:  Plain film from the previous day. FINDINGS: Lower chest: No acute abnormality. Hepatobiliary: No focal liver abnormality is seen. No gallstones, gallbladder wall thickening, or biliary dilatation. Pancreas: Unremarkable. No pancreatic ductal dilatation or surrounding inflammatory changes. Spleen: Normal in size without focal abnormality. Adrenals/Urinary Tract: Adrenal glands are within normal limits. Kidneys show a normal enhancement pattern. No renal calculi or obstructive changes are noted. The bladder is decompressed. Stomach/Bowel: Diverticular change of the colon is noted without evidence of diverticulitis in the mid descending colon. Tiny extraluminal focus of air is noted likely related micro perforation. No abscess is seen. The more proximal colon is unremarkable. Appendix is unremarkable. Small bowel and stomach are within normal limits. Vascular/Lymphatic: No significant vascular findings are present. No enlarged abdominal or pelvic lymph nodes. Reproductive: Prostate is unremarkable. Other: No abdominal wall hernia or abnormality. No abdominopelvic ascites. Musculoskeletal: No acute or significant osseous findings. IMPRESSION: Diverticulitis involving the mid descending colon. Changes consistent with micro  perforation are noted although no abscess is seen. No other focal abnormality is noted. Electronically Signed   By: Alcide Clever M.D.   On: 01/19/2023 02:57   DG Abdomen 1 View  Result Date: 01/18/2023 CLINICAL DATA:  Left lower quadrant pain.  Concern for stone. EXAM: ABDOMEN - 1 VIEW COMPARISON:  None Available. FINDINGS: No definite renal or ureteral stones are seen. There is a nonobstructive bowel gas pattern. There is no definite free intraperitoneal air in the imaged abdomen There is no acute osseous abnormality. IMPRESSION: Unremarkable KUB.  No definite stones identified. Electronically Signed   By: Lesia Hausen M.D.   On: 01/18/2023 15:06    Pending Labs Unresulted Labs (From admission, onward)     Start     Ordered   01/20/23 0500  CBC  Daily,   R      01/19/23 0417   01/20/23 0500  Basic metabolic panel  Tomorrow morning,   R        01/19/23 0545   01/19/23 0548  HIV Antibody (routine testing w rflx)  (HIV Antibody (Routine testing w reflex) panel)  Once,   R        01/19/23 0552   01/19/23 0320  Blood culture (routine x 2)  BLOOD CULTURE X 2,   R (with STAT occurrences)      01/19/23 0320            Vitals/Pain Today's Vitals   01/19/23 0457 01/19/23 0530 01/19/23 0725 01/19/23 0726  BP:  121/77    Pulse:  60    Resp:  16    Temp:      TempSrc:      SpO2:  96% 95%   Weight:      Height:      PainSc: 5    Asleep    Isolation Precautions No active isolations  Medications Medications  piperacillin-tazobactam (ZOSYN) IVPB 3.375 g (has no administration in time range)  lactated ringers bolus 1,000 mL (has no administration in time range)  HYDROmorphone (  DILAUDID) injection 0.5-2 mg (has no administration in time range)  oxyCODONE (Oxy IR/ROXICODONE) immediate release tablet 5-10 mg (has no administration in time range)  traMADol (ULTRAM) tablet 50-100 mg (has no administration in time range)  ondansetron (ZOFRAN) injection 4 mg (has no administration in time  range)    Or  ondansetron (ZOFRAN) 8 mg in sodium chloride 0.9 % 50 mL IVPB (has no administration in time range)  prochlorperazine (COMPAZINE) injection 5-10 mg (has no administration in time range)  phenol (CHLORASEPTIC) mouth spray 2 spray (has no administration in time range)  menthol-cetylpyridinium (CEPACOL) lozenge 3 mg (has no administration in time range)  magic mouthwash (has no administration in time range)  alum & mag hydroxide-simeth (MAALOX/MYLANTA) 200-200-20 MG/5ML suspension 30 mL (has no administration in time range)  simethicone (MYLICON) 40 MG/0.6ML suspension 80 mg (has no administration in time range)  naphazoline-glycerin (CLEAR EYES REDNESS) ophth solution 1-2 drop (has no administration in time range)  sodium chloride (OCEAN) 0.65 % nasal spray 1-2 spray (has no administration in time range)  enoxaparin (LOVENOX) injection 40 mg (has no administration in time range)  lactated ringers infusion ( Intravenous New Bag/Given 01/19/23 1610)  acetaminophen (TYLENOL) tablet 650 mg (650 mg Oral Given 01/18/23 2100)  sodium chloride 0.9 % bolus 1,000 mL (0 mLs Intravenous Stopped 01/19/23 0358)  HYDROmorphone (DILAUDID) injection 1 mg (1 mg Intravenous Given 01/19/23 0224)  ondansetron (ZOFRAN) injection 4 mg (4 mg Intravenous Given 01/19/23 0224)  iohexol (OMNIPAQUE) 300 MG/ML solution 100 mL (100 mLs Intravenous Contrast Given 01/19/23 0240)  piperacillin-tazobactam (ZOSYN) IVPB 3.375 g (0 g Intravenous Stopped 01/19/23 0447)  HYDROmorphone (DILAUDID) injection 1 mg (1 mg Intravenous Given 01/19/23 0408)  lactated ringers bolus 1,000 mL (0 mLs Intravenous Stopped 01/19/23 0554)    Mobility walks     Focused Assessments Neuro Assessment Handoff:  Swallow screen pass?  N/A         Neuro Assessment:   Neuro Checks:      Has TPA been given? No If patient is a Neuro Trauma and patient is going to OR before floor call report to 4N Charge nurse: [phone] or  [phone]   R Recommendations: See Admitting Provider Note  Report given to:   Additional Notes:

## 2023-01-20 LAB — CBC
HCT: 39.1 % (ref 39.0–52.0)
Hemoglobin: 12.6 g/dL — ABNORMAL LOW (ref 13.0–17.0)
MCH: 29.8 pg (ref 26.0–34.0)
MCHC: 32.2 g/dL (ref 30.0–36.0)
MCV: 92.4 fL (ref 80.0–100.0)
Platelets: 232 10*3/uL (ref 150–400)
RBC: 4.23 MIL/uL (ref 4.22–5.81)
RDW: 12.4 % (ref 11.5–15.5)
WBC: 9.9 10*3/uL (ref 4.0–10.5)
nRBC: 0 % (ref 0.0–0.2)

## 2023-01-20 LAB — BASIC METABOLIC PANEL
Anion gap: 9 (ref 5–15)
BUN: 8 mg/dL (ref 6–20)
CO2: 27 mmol/L (ref 22–32)
Calcium: 8.7 mg/dL — ABNORMAL LOW (ref 8.9–10.3)
Chloride: 101 mmol/L (ref 98–111)
Creatinine, Ser: 0.84 mg/dL (ref 0.61–1.24)
GFR, Estimated: >60 mL/min (ref >60)
Glucose, Bld: 87 mg/dL (ref 70–99)
Potassium: 3.8 mmol/L (ref 3.5–5.1)
Sodium: 137 mmol/L (ref 135–145)

## 2023-01-20 NOTE — Progress Notes (Signed)
PROGRESS NOTE    Tom Le  ZOX:[id number] DOB: 1980/06/28 DOA: 01/18/2023 PCP: Patient, No Pcp Per   Brief Narrative: Tom Le is a 42 y.o. male with a history of obesity.  Patient presented secondary to secondary to flank pain and was found to have evidence of acute diverticulitis with concern for microperforation on CT imaging.  Empiric antibiotics started.  General surgery consulted. Diet advancing slowly.   Assessment/Plan:  Acute diverticulitis with microperforation Patient presented with flank pain.  CT abdomen/pelvis on admission was significant for diverticulitis involving the mid descending colon with changes consistent with microperforation.  General surgery was consulted.  Empiric Zosyn started on admission.  Blood cultures obtained and are no growth to date. -Continue Zosyn -Follow general surgery recommendations: Clear liquid diet, IVF, conservative management  Hypokalemia Potassium is low was 3.2 this admission. Resolved with potassium supplementation.  Hypocalcemia Mild.  Calcium of 8.7 this morning. -Tums  Hyperbilirubinemia Mild.  Possibly secondary to acute illness.  No liver or biliary pathology seen on CT imaging.  Obesity Estimated body mass index is 31.96 kg/m as calculated from the following:   Height as of this encounter: 5\' 6"  (1.676 m).   Weight as of this encounter: 89.8 kg.   DVT prophylaxis: Lovenox Code Status:   Code Status: Full Code Family Communication: None at bedside Disposition Plan: Discharge home likely in 1-2 days pending ability to advance diet and further general surgery recommendations.   Consultants:  General surgery  Procedures:  None  Antimicrobials: Zosyn IV   Subjective: Patient reports improvement in pain. Happy to have diet advanced this morning.  Objective: BP 116/72 (BP Location: Right Arm)   Pulse (!) 53   Temp 97.9 F (36.6 C)   Resp 18   Ht 5\' 6"  (1.676 m)   Wt 89.8 kg   SpO2 96%    BMI 31.96 kg/m   Examination:  General exam: Appears calm and comfortable Respiratory system: Clear to auscultation. Respiratory effort normal. Cardiovascular system: S1 & S2 heard, RRR. No murmurs. Gastrointestinal system: Abdomen is non-distended, soft and nontender. Normal bowel sounds heard. Central nervous system: Alert and oriented. No focal neurological deficits. Musculoskeletal: No edema. No calf tenderness Skin: No cyanosis. No rashes Psychiatry: Judgement and insight appear normal. Mood & affect appropriate.    Data Reviewed: I have personally reviewed following labs and imaging studies   Last CBC Lab Results  Component Value Date   WBC 9.9 01/20/2023   HGB 12.6 (L) 01/20/2023   HCT 39.1 01/20/2023   MCV 92.4 01/20/2023   MCH 29.8 01/20/2023   RDW 12.4 01/20/2023   PLT 232 01/20/2023     Last metabolic panel Lab Results  Component Value Date   GLUCOSE 87 01/20/2023   NA 137 01/20/2023   K 3.8 01/20/2023   CL 101 01/20/2023   CO2 27 01/20/2023   BUN 8 01/20/2023   CREATININE 0.84 01/20/2023   GFRNONAA >60 01/20/2023   CALCIUM 8.7 (L) 01/20/2023   PROT 7.7 01/18/2023   ALBUMIN 4.2 01/18/2023   BILITOT 1.8 (H) 01/18/2023   ALKPHOS 66 01/18/2023   AST 20 01/18/2023   ALT 22 01/18/2023   ANIONGAP 9 01/20/2023     Creatinine Clearance: Estimated Creatinine Clearance: 123.9 mL/min (by C-G formula based on SCr of 0.84 mg/dL).   Radiology Studies: CT ABDOMEN PELVIS W CONTRAST  Result Date: 01/19/2023 CLINICAL DATA:  Left-sided flank pain for 1 day with microhematuria, initial encounter EXAM: CT ABDOMEN AND PELVIS WITH  CONTRAST TECHNIQUE: Multidetector CT imaging of the abdomen and pelvis was performed using the standard protocol following bolus administration of intravenous contrast. RADIATION DOSE REDUCTION: This exam was performed according to the departmental dose-optimization program which includes automated exposure control, adjustment of the mA  and/or kV according to patient size and/or use of iterative reconstruction technique. CONTRAST:  OMNIPAQUE IOHEXOL 300 MG/ML  SOLN COMPARISON:  Plain film from the previous day. FINDINGS: Lower chest: No acute abnormality. Hepatobiliary: No focal liver abnormality is seen. No gallstones, gallbladder wall thickening, or biliary dilatation. Pancreas: Unremarkable. No pancreatic ductal dilatation or surrounding inflammatory changes. Spleen: Normal in size without focal abnormality. Adrenals/Urinary Tract: Adrenal glands are within normal limits. Kidneys show a normal enhancement pattern. No renal calculi or obstructive changes are noted. The bladder is decompressed. Stomach/Bowel: Diverticular change of the colon is noted without evidence of diverticulitis in the mid descending colon. Tiny extraluminal focus of air is noted likely related micro perforation. No abscess is seen. The more proximal colon is unremarkable. Appendix is unremarkable. Small bowel and stomach are within normal limits. Vascular/Lymphatic: No significant vascular findings are present. No enlarged abdominal or pelvic lymph nodes. Reproductive: Prostate is unremarkable. Other: No abdominal wall hernia or abnormality. No abdominopelvic ascites. Musculoskeletal: No acute or significant osseous findings. IMPRESSION: Diverticulitis involving the mid descending colon. Changes consistent with micro perforation are noted although no abscess is seen. No other focal abnormality is noted. Electronically Signed   By: Alcide Clever M.D.   On: 01/19/2023 02:57      LOS: 1 day    Jacquelin Hawking, MD [facility] 01/20/2023, 2:06 PM   If 7PM-7AM, please contact night-coverage [email or web address]

## 2023-01-20 NOTE — Progress Notes (Signed)
Subjective/Chief Complaint: Still complains of pain left flank. May be slightly better   Objective: Vital signs in last 24 hours: Temp:  [97.5 F (36.4 C)-98.4 F (36.9 C)] 98.4 F (36.9 C) (08/17 0543) Pulse Rate:  [56-64] 61 (08/17 0543) Resp:  [17-18] 18 (08/17 0543) BP: (107-125)/(59-68) 125/67 (08/17 0543) SpO2:  [95 %-97 %] 96 % (08/17 0543) Last BM Date : 01/18/23  Intake/Output from previous day: 08/16 0701 - 08/17 0700 In: 2491.1 [P.O.:30; I.V.:2354.4; IV Piggyback:106.7] Out: -  Intake/Output this shift: No intake/output data recorded.  General appearance: alert and cooperative Resp: clear to auscultation bilaterally Cardio: regular rate and rhythm GI: soft, moderate focal tenderness left abd  Lab Results:  Recent Labs    01/18/23 2058 01/20/23 0502  WBC 13.5* 9.9  HGB 13.9 12.6*  HCT 41.6 39.1  PLT 266 232   BMET Recent Labs    01/19/23 0450 01/20/23 0502  NA 133* 137  K 3.3* 3.8  CL 102 101  CO2 22 27  GLUCOSE 108* 87  BUN 11 8  CREATININE 0.77 0.84  CALCIUM 8.3* 8.7*   PT/INR No results for input(s): "LABPROT", "INR" in the last 72 hours. ABG No results for input(s): "PHART", "HCO3" in the last 72 hours.  Invalid input(s): "PCO2", "PO2"  Studies/Results: CT ABDOMEN PELVIS W CONTRAST  Result Date: 01/19/2023 CLINICAL DATA:  Left-sided flank pain for 1 day with microhematuria, initial encounter EXAM: CT ABDOMEN AND PELVIS WITH CONTRAST TECHNIQUE: Multidetector CT imaging of the abdomen and pelvis was performed using the standard protocol following bolus administration of intravenous contrast. RADIATION DOSE REDUCTION: This exam was performed according to the departmental dose-optimization program which includes automated exposure control, adjustment of the mA and/or kV according to patient size and/or use of iterative reconstruction technique. CONTRAST:  OMNIPAQUE IOHEXOL 300 MG/ML  SOLN COMPARISON:  Plain film from the previous  day. FINDINGS: Lower chest: No acute abnormality. Hepatobiliary: No focal liver abnormality is seen. No gallstones, gallbladder wall thickening, or biliary dilatation. Pancreas: Unremarkable. No pancreatic ductal dilatation or surrounding inflammatory changes. Spleen: Normal in size without focal abnormality. Adrenals/Urinary Tract: Adrenal glands are within normal limits. Kidneys show a normal enhancement pattern. No renal calculi or obstructive changes are noted. The bladder is decompressed. Stomach/Bowel: Diverticular change of the colon is noted without evidence of diverticulitis in the mid descending colon. Tiny extraluminal focus of air is noted likely related micro perforation. No abscess is seen. The more proximal colon is unremarkable. Appendix is unremarkable. Small bowel and stomach are within normal limits. Vascular/Lymphatic: No significant vascular findings are present. No enlarged abdominal or pelvic lymph nodes. Reproductive: Prostate is unremarkable. Other: No abdominal wall hernia or abnormality. No abdominopelvic ascites. Musculoskeletal: No acute or significant osseous findings. IMPRESSION: Diverticulitis involving the mid descending colon. Changes consistent with micro perforation are noted although no abscess is seen. No other focal abnormality is noted. Electronically Signed   By: Alcide Clever M.D.   On: 01/19/2023 02:57   DG Abdomen 1 View  Result Date: 01/18/2023 CLINICAL DATA:  Left lower quadrant pain.  Concern for stone. EXAM: ABDOMEN - 1 VIEW COMPARISON:  None Available. FINDINGS: No definite renal or ureteral stones are seen. There is a nonobstructive bowel gas pattern. There is no definite free intraperitoneal air in the imaged abdomen There is no acute osseous abnormality. IMPRESSION: Unremarkable KUB.  No definite stones identified. Electronically Signed   By: Lesia Hausen M.D.   On: 01/18/2023 15:06  Anti-infectives: Anti-infectives (From admission, onward)    Start      Dose/Rate Route Frequency Ordered Stop   01/19/23 1000  piperacillin-tazobactam (ZOSYN) IVPB 3.375 g        3.375 g 12.5 mL/hr over 240 Minutes Intravenous Every 8 hours 01/19/23 0408     01/19/23 0330  piperacillin-tazobactam (ZOSYN) IVPB 3.375 g        3.375 g 100 mL/hr over 30 Minutes Intravenous  Once 01/19/23 0326 01/19/23 0447       Assessment/Plan: s/p * No surgery found * Advance diet. Allow clears today Continue IV zosyn Ambulate Left colon diverticulitis with microperf  LOS: 1 day    Chevis Pretty III 01/20/2023

## 2023-01-21 LAB — CBC
HCT: 36.6 % — ABNORMAL LOW (ref 39.0–52.0)
Hemoglobin: 12.1 g/dL — ABNORMAL LOW (ref 13.0–17.0)
MCH: 30.2 pg (ref 26.0–34.0)
MCHC: 33.1 g/dL (ref 30.0–36.0)
MCV: 91.3 fL (ref 80.0–100.0)
Platelets: 267 K/uL (ref 150–400)
RBC: 4.01 MIL/uL — ABNORMAL LOW (ref 4.22–5.81)
RDW: 12.1 % (ref 11.5–15.5)
WBC: 6 K/uL (ref 4.0–10.5)
nRBC: 0 % (ref 0.0–0.2)

## 2023-01-21 MED ORDER — AMOXICILLIN-POT CLAVULANATE 875-125 MG PO TABS
1.0000 | ORAL_TABLET | Freq: Two times a day (BID) | ORAL | Status: DC
  Administered 2023-01-21 – 2023-01-22 (×3): 1 via ORAL
  Filled 2023-01-21 (×3): qty 1

## 2023-01-21 MED ORDER — MELATONIN 5 MG PO TABS
5.0000 mg | ORAL_TABLET | Freq: Once | ORAL | Status: AC
  Administered 2023-01-21: 5 mg via ORAL
  Filled 2023-01-21: qty 1

## 2023-01-21 NOTE — Progress Notes (Signed)
   Subjective/Chief Complaint: Feels much better today   Objective: Vital signs in last 24 hours: Temp:  [97.9 F (36.6 C)-98.5 F (36.9 C)] 98.5 F (36.9 C) (08/18 0533) Pulse Rate:  [51-74] 51 (08/18 0533) Resp:  [18-20] 18 (08/18 0533) BP: (115-133)/(71-87) 115/71 (08/18 0533) SpO2:  [96 %-100 %] 97 % (08/18 0533) Last BM Date : 01/18/23  Intake/Output from previous day: 08/17 0701 - 08/18 0700 In: 3787.6 [P.O.:840; I.V.:2784.2; IV Piggyback:163.3] Out: -  Intake/Output this shift: No intake/output data recorded.  General appearance: alert and cooperative Resp: clear to auscultation bilaterally Cardio: regular rate and rhythm GI: soft, nontender  Lab Results:  Recent Labs    01/20/23 0502 01/21/23 0448  WBC 9.9 6.0  HGB 12.6* 12.1*  HCT 39.1 36.6*  PLT 232 267   BMET Recent Labs    01/19/23 0450 01/20/23 0502  NA 133* 137  K 3.3* 3.8  CL 102 101  CO2 22 27  GLUCOSE 108* 87  BUN 11 8  CREATININE 0.77 0.84  CALCIUM 8.3* 8.7*   PT/INR No results for input(s): "LABPROT", "INR" in the last 72 hours. ABG No results for input(s): "PHART", "HCO3" in the last 72 hours.  Invalid input(s): "PCO2", "PO2"  Studies/Results: No results found.  Anti-infectives: Anti-infectives (From admission, onward)    Start     Dose/Rate Route Frequency Ordered Stop   01/21/23 1000  amoxicillin-clavulanate (AUGMENTIN) 875-125 MG per tablet 1 tablet        1 tablet Oral Every 12 hours 01/21/23 0839     01/19/23 1000  piperacillin-tazobactam (ZOSYN) IVPB 3.375 g        3.375 g 12.5 mL/hr over 240 Minutes Intravenous Every 8 hours 01/19/23 0408     01/19/23 0330  piperacillin-tazobactam (ZOSYN) IVPB 3.375 g        3.375 g 100 mL/hr over 30 Minutes Intravenous  Once 01/19/23 0326 01/19/23 0447       Assessment/Plan: s/p * No surgery found * Advance diet Wbc normal. Will switch to augmentin and stop zosyn Ambulate If he continues to improve then he may be ready  for d/c tomorrow. Will follow. Will need follow up with our colorectal surgeons  LOS: 2 days    Tom Le 01/21/2023

## 2023-01-21 NOTE — Progress Notes (Signed)
Transition of Care [facility]) - Inpatient Brief Assessment   Patient Details  Name: Prashant Pelly MRN: [id number] Date of Birth: 25-Jan-1981  Transition of Care [facility]) CM/SW Contact:    Larrie Kass, LCSW Phone Number: 01/21/2023, 2:42 PM   Clinical Narrative: Uninsured pt with no PCP. Spoke with pt and suggested that, pt follow up with [facility] to establish PCP, and add contact information to pt's AVS. Pt with no DME or transportation needs. Pt reports he can pay for some medications and does not think he will need assistance at d/c.  TOC sign off.    Transition of Care Asessment: Insurance and Status: Selfpay Patient has primary care physician: No (attached [facility]) Home environment has been reviewed: yes Prior level of function:: independent Prior/Current Home Services: No current home services Social Determinants of Health Reivew: SDOH reviewed no interventions necessary Readmission risk has been reviewed: No Transition of care needs: no transition of care needs at this time

## 2023-01-21 NOTE — Plan of Care (Signed)

## 2023-01-21 NOTE — Progress Notes (Signed)
   PROGRESS NOTE    Tom Le  ZOX:[id number] DOB: 1981-02-17 DOA: 01/18/2023 PCP: Patient, No Pcp Per   Brief Narrative: Tom Le is a 42 y.o. male with a history of obesity.  Patient presented secondary to secondary to flank pain and was found to have evidence of acute diverticulitis with concern for microperforation on CT imaging.  Empiric antibiotics started.  General surgery consulted. Diet advancing.   Assessment/Plan:  Acute diverticulitis with microperforation Patient presented with flank pain.  CT abdomen/pelvis on admission was significant for diverticulitis involving the mid descending colon with changes consistent with microperforation.  General surgery was consulted.  Empiric Zosyn started on admission.  Blood cultures obtained and are no growth to date. -Follow general surgery recommendations: Soft diet, conservative management, Augmentin  Hypokalemia Potassium is low was 3.2 this admission. Resolved with potassium supplementation.  Hypocalcemia Mild. Improved with calcium supplementation.  Hyperbilirubinemia Mild.  Possibly secondary to acute illness.  No liver or biliary pathology seen on CT imaging.  Obesity Estimated body mass index is 31.96 kg/m as calculated from the following:   Height as of this encounter: 5\' 6"  (1.676 m).   Weight as of this encounter: 89.8 kg.   DVT prophylaxis: Lovenox Code Status:   Code Status: Full Code Family Communication: None at bedside Disposition Plan: Discharge home likely in 1 days pending ability to tolerate diet and further general surgery recommendations.   Consultants:  General surgery  Procedures:  None  Antimicrobials: Zosyn IV   Subjective: Abdominal pain continues to improve. No other concerns.  Objective: BP 117/72 (BP Location: Right Arm)   Pulse (!) 55   Temp 98.4 F (36.9 C) (Oral)   Resp 18   Ht 5\' 6"  (1.676 m)   Wt 89.8 kg   SpO2 99%   BMI 31.96 kg/m    Examination: General exam: Appears calm and comfortable Respiratory system: Clear to auscultation. Respiratory effort normal. Cardiovascular system: S1 & S2 heard, RRR. Gastrointestinal system: Abdomen is nondistended, soft and nontender. Normal bowel sounds heard. Central nervous system: Alert and oriented. No focal neurological deficits. Musculoskeletal: No edema. No calf tenderness Skin: No cyanosis. No rashes Psychiatry: Judgement and insight appear normal. Mood & affect appropriate.    Data Reviewed: I have personally reviewed following labs and imaging studies   Last CBC Lab Results  Component Value Date   WBC 6.0 01/21/2023   HGB 12.1 (L) 01/21/2023   HCT 36.6 (L) 01/21/2023   MCV 91.3 01/21/2023   MCH 30.2 01/21/2023   RDW 12.1 01/21/2023   PLT 267 01/21/2023     Last metabolic panel Lab Results  Component Value Date   GLUCOSE 87 01/20/2023   NA 137 01/20/2023   K 3.8 01/20/2023   CL 101 01/20/2023   CO2 27 01/20/2023   BUN 8 01/20/2023   CREATININE 0.84 01/20/2023   GFRNONAA >60 01/20/2023   CALCIUM 8.7 (L) 01/20/2023   PROT 7.7 01/18/2023   ALBUMIN 4.2 01/18/2023   BILITOT 1.8 (H) 01/18/2023   ALKPHOS 66 01/18/2023   AST 20 01/18/2023   ALT 22 01/18/2023   ANIONGAP 9 01/20/2023     Creatinine Clearance: Estimated Creatinine Clearance: 123.9 mL/min (by C-G formula based on SCr of 0.84 mg/dL).   Radiology Studies: No results found.    LOS: 2 days    Jacquelin Hawking, MD [facility] 01/21/2023, 2:38 PM   If 7PM-7AM, please contact night-coverage [email or web address]

## 2023-01-22 LAB — CBC
HCT: 36.5 % — ABNORMAL LOW (ref 39.0–52.0)
Hemoglobin: 12.2 g/dL — ABNORMAL LOW (ref 13.0–17.0)
MCH: 30.7 pg (ref 26.0–34.0)
MCHC: 33.4 g/dL (ref 30.0–36.0)
MCV: 91.9 fL (ref 80.0–100.0)
Platelets: 301 10*3/uL (ref 150–400)
RBC: 3.97 MIL/uL — ABNORMAL LOW (ref 4.22–5.81)
RDW: 12.2 % (ref 11.5–15.5)
WBC: 5.7 10*3/uL (ref 4.0–10.5)
nRBC: 0 % (ref 0.0–0.2)

## 2023-01-22 MED ORDER — AMOXICILLIN-POT CLAVULANATE 875-125 MG PO TABS: 1.0000 | ORAL_TABLET | Freq: Two times a day (BID) | ORAL | 0 refills | Status: AC

## 2023-01-22 NOTE — Discharge Instructions (Signed)
Tom Le,  You were in the hospital with diverticulitis. This was managed with IV antibiotics with significant improvement of symptoms. You have now been transitioned to oral antibiotics and can follow-up with your PCP and a GI physician on discharge. Please continue your antibiotics as prescribed. Please continue a low fiber diet for the next two weeks before increasing fiber intake.

## 2023-01-22 NOTE — Progress Notes (Signed)
       Subjective: Feeling much better.  Minimal discomfort.  Tolerating solid diet.  ROS: See above, otherwise other systems negative  Objective: Vital signs in last 24 hours: Temp:  [97.7 F (36.5 C)-98.4 F (36.9 C)] 97.7 F (36.5 C) (08/19 0343) Pulse Rate:  [55-74] 74 (08/19 0343) Resp:  [16-18] 18 (08/19 0343) BP: (104-119)/(66-74) 104/66 (08/19 0343) SpO2:  [97 %-99 %] 97 % (08/19 0343) Last BM Date : 01/21/23  Intake/Output from previous day: 08/18 0701 - 08/19 0700 In: 2270.5 [P.O.:840; I.V.:1393; IV Piggyback:37.5] Out: -  Intake/Output this shift: No intake/output data recorded.  PE: Abd: soft, NT, Nd, +BS  Lab Results:  Recent Labs    01/21/23 0448 01/22/23 0440  WBC 6.0 5.7  HGB 12.1* 12.2*  HCT 36.6* 36.5*  PLT 267 301   BMET Recent Labs    01/20/23 0502  NA 137  K 3.8  CL 101  CO2 27  GLUCOSE 87  BUN 8  CREATININE 0.84  CALCIUM 8.7*   PT/INR No results for input(s): "LABPROT", "INR" in the last 72 hours. CMP     Component Value Date/Time   NA 137 01/20/2023 0502   K 3.8 01/20/2023 0502   CL 101 01/20/2023 0502   CO2 27 01/20/2023 0502   GLUCOSE 87 01/20/2023 0502   BUN 8 01/20/2023 0502   CREATININE 0.84 01/20/2023 0502   CALCIUM 8.7 (L) 01/20/2023 0502   PROT 7.7 01/18/2023 2058   ALBUMIN 4.2 01/18/2023 2058   AST 20 01/18/2023 2058   ALT 22 01/18/2023 2058   ALKPHOS 66 01/18/2023 2058   BILITOT 1.8 (H) 01/18/2023 2058   GFRNONAA >60 01/20/2023 0502   GFRAA >60 01/19/2016 0801   Lipase  No results found for: "LIPASE"     Studies/Results: No results found.  Anti-infectives: Anti-infectives (From admission, onward)    Start     Dose/Rate Route Frequency Ordered Stop   01/21/23 1000  amoxicillin-clavulanate (AUGMENTIN) 875-125 MG per tablet 1 tablet        1 tablet Oral Every 12 hours 01/21/23 0839     01/19/23 1000  piperacillin-tazobactam (ZOSYN) IVPB 3.375 g  Status:  Discontinued        3.375 g 12.5 mL/hr  over 240 Minutes Intravenous Every 8 hours 01/19/23 0408 01/21/23 0842   01/19/23 0330  piperacillin-tazobactam (ZOSYN) IVPB 3.375 g        3.375 g 100 mL/hr over 30 Minutes Intravenous  Once 01/19/23 0326 01/19/23 0447        Assessment/Plan Descending colonic diverticulitis with tiny microperf  -doing much better -WBC normal  -tolerating soft diet -GI referral as outpatient for colonoscopy in 6-8 weeks.  Referral placed -no surgical follow up indicated at this time -a total of around 10 days of abx therapy should be adequate. -message sent to primary team.  Surgically stable for DC  FEN - soft VTE - Lovenox ID - Augmentin  I reviewed hospitalist notes, last 24 h vitals and pain scores, last 48 h intake and output, last 24 h labs and trends, and last 24 h imaging results.   LOS: 3 days    Letha Cape , [facility] Surgery 01/22/2023, 10:50 AM Please see Amion for pager number during day hours 7:00am-4:30pm or 7:00am -11:30am on weekends

## 2023-01-22 NOTE — Progress Notes (Signed)
Discharge instructions reviewed w/patient who verbalized an understanding. No other questions at this time Pt d/c home with girlfriend who picked pt up at main entrance

## 2023-01-22 NOTE — Discharge Summary (Signed)
Physician Discharge Summary   Patient: Tom Le MRN: [id number] DOB: March 29, 1981  Admit date:     01/18/2023  Discharge date: 01/22/23  Discharge Physician: Jacquelin Hawking, MD   PCP: Patient, No Pcp Per   Recommendations at discharge:  PCP and GI follow-up  Discharge Diagnoses: Principal Problem:   Diverticulitis of descending colon with perforation Active Problems:   Obesity (BMI 30-39.9)  Resolved Problems:   * No resolved hospital problems. *  Hospital Course: Tom Le is a 42 y.o. male with a history of obesity.  Patient presented secondary to secondary to flank pain and was found to have evidence of acute diverticulitis with concern for microperforation on CT imaging.  Empiric antibiotics started.  General surgery consulted. Diet advanced successfully. Recommendation to discharge on Augmentin to complete a 10 day course of antibiotics.  Assessment and Plan:  Acute diverticulitis with microperforation Patient presented with flank pain.  CT abdomen/pelvis on admission was significant for diverticulitis involving the mid descending colon with changes consistent with microperforation.  General surgery was consulted.  Empiric Zosyn started on admission.  Blood cultures obtained and are no growth to date. General surgery advanced the patient's diet successfully and transitioned antibiotics from Zosyn to Augmentin with recommendations to complete a 10 day course. Referral placed to GI.   Hypokalemia Potassium is low was 3.2 this admission. Resolved with potassium supplementation.   Hypocalcemia Mild. Improved with calcium supplementation.   Hyperbilirubinemia Mild.  Possibly secondary to acute illness.  No liver or biliary pathology seen on CT imaging.   Obesity Estimated body mass index is 31.96 kg/m as calculated from the following:   Height as of this encounter: 5\' 6"  (1.676 m).   Weight as of this encounter: 89.8 kg.   Consultants: General  surgery Procedures performed: None  Disposition: Home Diet recommendation: Soft diet   DISCHARGE MEDICATION: Allergies as of 01/22/2023   No Known Allergies      Medication List     TAKE these medications    amoxicillin-clavulanate 875-125 MG tablet Commonly known as: AUGMENTIN Take 1 tablet by mouth 2 (two) times daily for 7 days.   tamsulosin 0.4 MG Caps capsule Commonly known as: Flomax Take 1 capsule (0.4 mg total) by mouth daily.        Follow-up Information     [facility]. Schedule an appointment as soon as possible for a visit.   Why: Follow up Contact information: [address] [employer] [address] [place] [address] [phone]        [facility] Follow up.   Specialty: Gastroenterology Why: their office should call you with follow up information. referral placed. Contact information: [address] [place] [address] [phone]               Discharge Exam: BP 104/66 (BP Location: Right Arm)   Pulse 74   Temp 97.7 F (36.5 C) (Oral)   Resp 18   Ht 5\' 6"  (1.676 m)   Wt 89.8 kg   SpO2 97%   BMI 31.96 kg/m   General exam: Appears calm and comfortable Respiratory system: Clear to auscultation. Respiratory effort normal. Cardiovascular system: S1 & S2 heard, RRR. No murmurs, rubs, gallops or clicks. Gastrointestinal system: Abdomen is nondistended, soft and nontender. Normal bowel sounds heard. Central nervous system: Alert and oriented. No focal neurological deficits. Psychiatry: Judgement and insight appear normal. Mood & affect appropriate.   Condition at discharge: stable  The  results of significant diagnostics from this hospitalization (including imaging, microbiology, ancillary and laboratory) are listed below for reference.   Imaging Studies: CT ABDOMEN PELVIS W CONTRAST  Result Date: 01/19/2023 CLINICAL DATA:  Left-sided flank pain  for 1 day with microhematuria, initial encounter EXAM: CT ABDOMEN AND PELVIS WITH CONTRAST TECHNIQUE: Multidetector CT imaging of the abdomen and pelvis was performed using the standard protocol following bolus administration of intravenous contrast. RADIATION DOSE REDUCTION: This exam was performed according to the departmental dose-optimization program which includes automated exposure control, adjustment of the mA and/or kV according to patient size and/or use of iterative reconstruction technique. CONTRAST:  OMNIPAQUE IOHEXOL 300 MG/ML  SOLN COMPARISON:  Plain film from the previous day. FINDINGS: Lower chest: No acute abnormality. Hepatobiliary: No focal liver abnormality is seen. No gallstones, gallbladder wall thickening, or biliary dilatation. Pancreas: Unremarkable. No pancreatic ductal dilatation or surrounding inflammatory changes. Spleen: Normal in size without focal abnormality. Adrenals/Urinary Tract: Adrenal glands are within normal limits. Kidneys show a normal enhancement pattern. No renal calculi or obstructive changes are noted. The bladder is decompressed. Stomach/Bowel: Diverticular change of the colon is noted without evidence of diverticulitis in the mid descending colon. Tiny extraluminal focus of air is noted likely related micro perforation. No abscess is seen. The more proximal colon is unremarkable. Appendix is unremarkable. Small bowel and stomach are within normal limits. Vascular/Lymphatic: No significant vascular findings are present. No enlarged abdominal or pelvic lymph nodes. Reproductive: Prostate is unremarkable. Other: No abdominal wall hernia or abnormality. No abdominopelvic ascites. Musculoskeletal: No acute or significant osseous findings. IMPRESSION: Diverticulitis involving the mid descending colon. Changes consistent with micro perforation are noted although no abscess is seen. No other focal abnormality is noted. Electronically Signed   By: Alcide Clever M.D.   On:  01/19/2023 02:57   DG Abdomen 1 View  Result Date: 01/18/2023 CLINICAL DATA:  Left lower quadrant pain.  Concern for stone. EXAM: ABDOMEN - 1 VIEW COMPARISON:  None Available. FINDINGS: No definite renal or ureteral stones are seen. There is a nonobstructive bowel gas pattern. There is no definite free intraperitoneal air in the imaged abdomen There is no acute osseous abnormality. IMPRESSION: Unremarkable KUB.  No definite stones identified. Electronically Signed   By: Lesia Hausen M.D.   On: 01/18/2023 15:06    Microbiology: Results for orders placed or performed during the hospital encounter of 01/18/23  Blood culture (routine x 2)     Status: None (Preliminary result)   Collection Time: 01/19/23  3:45 AM   Specimen: BLOOD  Result Value Ref Range Status   Specimen Description   Final    BLOOD BLOOD LEFT HAND Performed at [facility], [address], [place], [place] [address]    Special Requests   Final    BOTTLES DRAWN AEROBIC AND ANAEROBIC Blood Culture adequate volume Performed at [facility], [address] [place], [place] [address]    Culture   Final    NO GROWTH 3 DAYS Performed at [facility], [address], [place], [place] [address]    Report Status PENDING  Incomplete  Blood culture (routine x 2)     Status: None (Preliminary result)   Collection Time: 01/19/23  3:45 AM   Specimen: BLOOD  Result Value Ref Range Status   Specimen Description BLOOD SITE NOT SPECIFIED  Final   Special Requests   Final    BOTTLES DRAWN AEROBIC AND ANAEROBIC Blood Culture adequate  volume   Culture   Final    NO GROWTH 3 DAYS Performed at [facility], [address] [place], [place] [address]    Report Status PENDING  Incomplete    Labs: CBC: Recent Labs  Lab 01/18/23 2058 01/20/23 0502 01/21/23 0448 01/22/23 0440  WBC 13.5* 9.9 6.0 5.7  NEUTROABS 11.0*  --   --   --   HGB 13.9 12.6* 12.1* 12.2*  HCT 41.6 39.1 36.6*  36.5*  MCV 91.2 92.4 91.3 91.9  PLT 266 232 267 301   Basic Metabolic Panel: Recent Labs  Lab 01/18/23 2058 01/19/23 0450 01/20/23 0502  NA 135 133* 137  K 3.2* 3.3* 3.8  CL 104 102 101  CO2 21* 22 27  GLUCOSE 118* 108* 87  BUN 13 11 8   CREATININE 0.86 0.77 0.84  CALCIUM 9.0 8.3* 8.7*   Liver Function Tests: Recent Labs  Lab 01/18/23 2058  AST 20  ALT 22  ALKPHOS 66  BILITOT 1.8*  PROT 7.7  ALBUMIN 4.2    Discharge time spent: 35 minutes.  Signed: Jacquelin Hawking, MD [facility] 01/22/2023

## 2023-01-24 LAB — CULTURE, BLOOD (ROUTINE X 2)
Culture: NO GROWTH
Culture: NO GROWTH
Special Requests: ADEQUATE
Special Requests: ADEQUATE
